# Patient Record
Sex: Female | Born: 1990 | Race: Black or African American | Hispanic: No | Marital: Single | Smoking: Never smoker
Health system: Southern US, Community
[De-identification: ages and names within clinical notes are randomized; demographics above are authoritative.]

## PROBLEM LIST (undated history)

## (undated) ENCOUNTER — Inpatient Hospital Stay (HOSPITAL_COMMUNITY): Payer: Self-pay

## (undated) DIAGNOSIS — N12 Tubulo-interstitial nephritis, not specified as acute or chronic: Secondary | ICD-10-CM

## (undated) DIAGNOSIS — N390 Urinary tract infection, site not specified: Secondary | ICD-10-CM

## (undated) DIAGNOSIS — D649 Anemia, unspecified: Secondary | ICD-10-CM

## (undated) DIAGNOSIS — A749 Chlamydial infection, unspecified: Secondary | ICD-10-CM

## (undated) HISTORY — PX: NO PAST SURGERIES: SHX2092

---
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
## 2010-03-07 ENCOUNTER — Ambulatory Visit: Payer: Self-pay | Admitting: Nurse Practitioner

## 2010-03-07 DIAGNOSIS — L42 Pityriasis rosea: Secondary | ICD-10-CM

## 2010-07-16 NOTE — Assessment & Plan Note (Signed)
Summary: Acute - Rash   Vital Signs:  Patient profile:   20 year old female Menstrual status:  regular LMP:     02/2010 Weight:      135.9 pounds BMI:     23.41 Temp:     97.2 degrees F oral Pulse rate:   72 / minute Pulse rhythm:   regular Resp:     16 per minute BP sitting:   120 / 80  (left arm) Cuff size:   regular  Vitals Entered By: Levon Hedger (March 07, 2010 2:02 PM) CC: rash on arm she thinks it may be ringworm and that it is spreading x 1 week Is Patient Diabetic? No Pain Assessment Patient in pain? no       Does patient need assistance? Functional Status Self care Ambulation Normal LMP (date): 02/2010 LMP - Character: light Menarche (age onset years): 10   Menses interval (days): 28 Menstrual flow (days): 5-6 Enter LMP: 02/2010 Last PAP Result  Specimen Adequacy: Satisfactory for evaluation.   Interpretation/Result:Negative for intraepithelial Lesion or Malignancy.      CC:  rash on arm she thinks it may be ringworm and that it is spreading x 1 week.  History of Present Illness:  Pt into the office for skin issues. Initial rash noted on her arm 2 weeks ago. She applied some clorox to the affected area. She has noted over the past 2 weeks the areas have spread down her left arm and also to the right arm,. chest, back, and left thigh. Areas do not itch. Never had this before. Denies any viral illness Pt does live on campus this year and has suitemates she does use shower shoes. Other suitemates with similar rash  Allergies (verified): No Known Drug Allergies  Review of Systems CV:  Denies chest pain or discomfort. Resp:  Denies cough. GI:  Denies abdominal pain, nausea, and vomiting. Derm:  Complains of rash.  Physical Exam  General:  alert.   Head:  normocephalic.   Lungs:  normal breath sounds.   Heart:  normal rate and regular rhythm.   Skin:  left upper arm with herald patch circular with central clearing left arm, right arm,  abd, and left thigh with smaller ovoid erythematous  with central clearing lestions. Psych:  Oriented X3.     Impression & Recommendations:  Problem # 1:  PITYRIASIS ROSEA (ICD-696.3) advisd pt of the dx  handout given will give antifungal cream but advised pt it will not be effective most importantly she will need to practice good hygiene  Complete Medication List: 1)  Clotrimazole-betamethasone 1-0.05 % Lotn (Clotrimazole-betamethasone) .... Apply topically two times a day to affected area  Patient Instructions: 1)  Read handout 2)  Follow up as needed Prescriptions: CLOTRIMAZOLE-BETAMETHASONE 1-0.05 % LOTN (CLOTRIMAZOLE-BETAMETHASONE) Apply topically two times a day to affected area  #45gm x 0   Entered and Authorized by:   Lehman Prom FNP   Signed by:   Lehman Prom FNP on 03/07/2010   Method used:   Print then Give to Patient   RxID:   [id number]

## 2010-07-16 NOTE — Letter (Signed)
Summary: Work Excuse  [facility]  [address] [place], [place] [address]   Phone: [phone]  Fax: [phone]    Today's Date: August 10, 2009  Name of Patient: Angelica Hester  The above named patient had a medical visit today   Please take this into consideration when reviewing the time away from school.    Special Instructions:  [  ] None  [ X ] To be off the remainder of today, returning to the normal work / school schedule tomorrow.  [  ] To be off until the next scheduled appointment on ______________________.  [  ] Other ________________________________________________________________ ________________________________________________________________________   Sincerely yours,   Lehman Prom FNP [facility]

## 2010-07-16 NOTE — Letter (Signed)
Summary: Handout Printed  Printed Handout:  - Pityriasis Rosea 

## 2010-07-16 NOTE — Letter (Signed)
Summary: [facility]//COMMUNICABLE DISEASE REPORT  [facility]//COMMUNICABLE DISEASE REPORT   Imported By: Arta Bruce 06/19/2009 14:46:19  _____________________________________________________________________  External Attachment:    Type:   Image     Comment:   External Document

## 2010-07-16 NOTE — Assessment & Plan Note (Signed)
Summary: Acute - Pharyngitis   Vital Signs:  Patient profile:   20 year old female Menstrual status:  regular LMP:     07/24/2009 Weight:      130.7 pounds Temp:     99.7 degrees F oral Pulse rate:   94 / minute Pulse rhythm:   regular Resp:     18 per minute BP sitting:   98 / 63  (left arm) Cuff size:   regular  Vitals Entered By: Geanie Cooley  (August 10, 2009 11:29 AM) CC: Pt is here for a sore throat that she has had for the last two days. Pt states she has taken Dayquil for it. Pt states shes been having a lot of chest congestion and coughing as well. Pt states she hasnt had any problems drinking or eating just alot of soreness. Is Patient Diabetic? No Pain Assessment Patient in pain? yes     Location: throat Intensity: 6 Type: aching  Does patient need assistance? Functional Status Self care Ambulation Normal LMP (date): 07/24/2009 LMP - Character: light Menarche (age onset years): 10   Menses interval (days): 28 Menstrual flow (days): 5-6 Menstrual Status regular Enter LMP: 07/24/2009 Last PAP Result  Specimen Adequacy: Satisfactory for evaluation.   Interpretation/Result:Negative for intraepithelial Lesion or Malignancy.      CC:  Pt is here for a sore throat that she has had for the last two days. Pt states she has taken Dayquil for it. Pt states shes been having a lot of chest congestion and coughing as well. Pt states she hasnt had any problems drinking or eating just alot of soreness.Marland Kitchen  History of Present Illness:  Pt into the office with sore throat -ear ache +headache -no nasal congestion  -low grade fever no nausea or vomiting +fatigue and lethergy  Pt is a student [employer]  Allergies (verified): No Known Drug Allergies  Review of Systems General:  Complains of fever. ENT:  Complains of sore throat; denies earache and nasal congestion. CV:  Denies chest pain or discomfort. Resp:  Denies cough.  Physical  Exam  General:  alert.   Head:  normocephalic.   Ears:  bil TM with bony landmarks present no erythema Mouth:  tonsillar enlargement x 2 exudate present bilaterally L>R uvula midline "fruity breath"   Impression & Recommendations:  Problem # 1:  ACUTE PHARYNGITIS (ICD-462) will send for culture gargle with warm salt water warm tea Her updated medication list for this problem includes:    Penicillin V Potassium 250 Mg/60ml Solr (Penicillin v potassium) .Marland Kitchen... 2 teaspoons by mouth three times a day  Orders: Rapid Strep ([address]) T-Culture, Throat ([address])  Complete Medication List: 1)  Nuvaring 0.12-0.015 Mg/24hr Ring (Etonogestrel-ethinyl estradiol) .... Insert into vagina and leave in for 3 weeks.remove for the fourth week to have a period.after the fourth week reinsert a new nuvoring 2)  Penicillin V Potassium 250 Mg/72ml Solr (Penicillin v potassium) .... 2 teaspoons by mouth three times a day  Patient Instructions: 1)  Rapid strep negative. 2)  Will send to lab for culture. 3)  Highly suspect strep throat. 4)  Will treat with penicillin three times daily x 7 days (with food - crackers/bread) 5)  Gargle with warm salt water 6)  drink warm tea 7)  Contagious until antibiotic in system for 1 day 8)  May take ibuprofen as needed for pain (with food)   R re-printed.  She is not able to take pills.  Prescriptions: PENICILLIN V  POTASSIUM 250 MG/5ML SOLR (PENICILLIN V POTASSIUM) 2 teaspoons by mouth three times a day  #272ml x 0   Entered and Authorized by:   Lehman Prom FNP   Signed by:   Lehman Prom FNP on 08/10/2009   Method used:   Printed then faxed to ...       [facility]  [address] [phone][facility] (retail)       [address]       [place], [place]  [id number]       Ph: [phone] or [phone]       Fax: [phone]   RxID:   [id number] PENICILLIN V POTASSIUM 500 MG TABS (PENICILLIN V POTASSIUM) One tablet by mouth three times  a day for infection  #21 x 0   Entered and Authorized by:   Lehman Prom FNP   Signed by:   Lehman Prom FNP on 08/10/2009   Method used:   Printed then faxed to ...       [facility]  [address] [phone]* (retail)       [address]       [place], [place]  [id number]       Ph: [phone] or [phone]       Fax: [phone]   RxID:   [id number]    Vital Signs:  Patient profile:   20 year old female Menstrual status:  regular LMP:     07/24/2009 Weight:      130.7 pounds Temp:     99.7 degrees F oral Pulse rate:   94 / minute Pulse rhythm:   regular Resp:     18 per minute BP sitting:   98 / 63  (left arm) Cuff size:   regular  Vitals Entered By: Geanie Cooley  (August 10, 2009 11:29 AM)   Laboratory Results  Date/Time Received: August 10, 2009 12:08 PM   Other Tests  Rapid Strep: negative

## 2010-07-16 NOTE — Letter (Signed)
Summary: Handout Printed  Printed Handout:  - Pharyngitis (Viral and Bacterial)

## 2010-08-24 ENCOUNTER — Inpatient Hospital Stay (INDEPENDENT_AMBULATORY_CARE_PROVIDER_SITE_OTHER)
Admission: RE | Admit: 2010-08-24 | Discharge: 2010-08-24 | Disposition: A | Discharge status: Home / Self Care | Payer: Self-pay | Source: Ambulatory Visit | Attending: Emergency Medicine | Admitting: Emergency Medicine

## 2010-08-24 DIAGNOSIS — R109 Unspecified abdominal pain: Secondary | ICD-10-CM

## 2010-08-24 DIAGNOSIS — R112 Nausea with vomiting, unspecified: Secondary | ICD-10-CM

## 2010-08-24 DIAGNOSIS — N946 Dysmenorrhea, unspecified: Secondary | ICD-10-CM

## 2010-08-24 LAB — BASIC METABOLIC PANEL
CO2: 23 mEq/L (ref 19–32)
Chloride: 110 mEq/L (ref 96–112)
Creatinine, Ser: 0.83 mg/dL (ref 0.4–1.2)
GFR calc Af Amer: >60 mL/min (ref >60)

## 2010-08-24 LAB — CBC
MCH: 27.8 pg (ref 26.0–34.0)
MCHC: 34.2 g/dL (ref 30.0–36.0)
Platelets: 214 10*3/uL (ref 150–400)

## 2010-08-24 LAB — DIFFERENTIAL
Basophils Relative: 0 % (ref 0–1)
Eosinophils Absolute: 0 10*3/uL (ref 0.0–0.7)
Monocytes Absolute: 0.5 10*3/uL (ref 0.1–1.0)
Monocytes Relative: 3 % (ref 3–12)

## 2010-08-24 LAB — AMYLASE: Amylase: 72 U/L (ref 0–105)

## 2010-08-26 LAB — POCT URINALYSIS DIPSTICK
Bilirubin Urine: NEGATIVE
Glucose, UA: NEGATIVE mg/dL
Specific Gravity, Urine: 1.025 (ref 1.005–1.030)
Urobilinogen, UA: 0.2 mg/dL (ref 0.0–1.0)

## 2010-09-03 LAB — COMPREHENSIVE METABOLIC PANEL
ALT: 16 U/L (ref 0–35)
AST: 20 U/L (ref 0–37)
Alkaline Phosphatase: 71 U/L (ref 39–117)
CO2: 22 mEq/L (ref 19–32)
Chloride: 108 mEq/L (ref 96–112)
GFR calc Af Amer: >60 mL/min (ref >60)
GFR calc non Af Amer: >60 mL/min (ref >60)
Potassium: 3.4 mEq/L — ABNORMAL LOW (ref 3.5–5.1)
Sodium: 138 mEq/L (ref 135–145)
Total Bilirubin: 0.4 mg/dL (ref 0.3–1.2)

## 2010-09-17 LAB — URINE MICROSCOPIC-ADD ON

## 2010-09-17 LAB — URINALYSIS, ROUTINE W REFLEX MICROSCOPIC
Glucose, UA: NEGATIVE mg/dL
Ketones, ur: >80 mg/dL — AB
Protein, ur: 30 mg/dL — AB
pH: 6 (ref 5.0–8.0)

## 2010-09-17 LAB — WET PREP, GENITAL: Trich, Wet Prep: NONE SEEN

## 2010-09-17 LAB — RPR: RPR Ser Ql: NONREACTIVE

## 2010-11-01 NOTE — Group Therapy Note (Signed)
Angelica, [name]:  [id number]   MEDICAL RECORD NO.:  [id number]          PATIENT TYPE:  WOC   LOCATION:  [facility]                   FACILITY:  [facility]   PHYSICIAN:  Kathlyn Sacramento, M.D.   DATE OF BIRTH:  27-Feb-1991   DATE OF SERVICE:  10/23/2005                                    CLINIC NOTE   CHIEF COMPLAINT:  Here for a Pap smear.   HPI:  Patient is a 20 year old African-American female who had an LDSIL Pap  smear on July 15, 2005, who is here for further evaluation and treatment.   ALLERGIES:  NONE.   MEDICINES:  Depo-Provera.   IMMUNIZATIONS:  Rubella, chicken pox and tetanus.   MENSTRUAL HISTORY:  First day of last period September 08, 2005.  Menstrual  cycle began at age 19 __________ menstrual cycles are regular, days between  cycles 26-31.  Periods last 5-7 days with light flow.  No pain with periods.  Bleeding with periods secondary to Depo.   CONTRACEPTIVES:  Depo.   GYN HISTORY:  Last Pap smear January, 2007.   SURGICAL HISTORY:  None.   FAMILY HISTORY:  None.   PAST MEDICAL HISTORY:  None.   SOCIAL HISTORY:  Lives with her mother.  Negative drug and alcohol and  tobacco abuse.   REVIEW OF SYSTEMS:  Negative except for normal menstrual bleeding.   LABS:  Patient had an LGSIL Pap smear on July 15, 2005.   PHYSICAL EXAM:  GENERAL:  A well-developed, well-nourished female in no  acute distress.  Temp 98.6, pulse 80, blood pressure 118/74, weight 118.3.  GU:  Normal external genitalia.  VAGINA:  Pink and rugated.  CERVIX:  Clean.   Pap smear was taken.   IMPRESSION:  Low grade squamous intraepithelial lesion.   PLAN:  A Pap smear with HPV was done.  Plan was discussed with Dr.  __________.  Patient was encouraged to use condoms and recommended Gardasil  vaccine that [facility].           ______________________________  Kathlyn Sacramento, M.D.     AC/MEDQ  D:  10/23/2005  T:  10/24/2005  Job:   [id number]

## 2011-07-24 ENCOUNTER — Emergency Department (INDEPENDENT_AMBULATORY_CARE_PROVIDER_SITE_OTHER)
Admission: EM | Admit: 2011-07-24 | Discharge: 2011-07-24 | Disposition: A | Discharge status: Home / Self Care | Payer: Self-pay | Source: Home / Self Care | Attending: Emergency Medicine | Admitting: Emergency Medicine

## 2011-07-24 ENCOUNTER — Encounter (HOSPITAL_COMMUNITY): Payer: Self-pay | Admitting: Emergency Medicine

## 2011-07-24 DIAGNOSIS — Z2089 Contact with and (suspected) exposure to other communicable diseases: Secondary | ICD-10-CM

## 2011-07-24 DIAGNOSIS — Z0000 Encounter for general adult medical examination without abnormal findings: Secondary | ICD-10-CM

## 2011-07-24 HISTORY — DX: Chlamydial infection, unspecified: A74.9

## 2011-07-24 HISTORY — DX: Anemia, unspecified: D64.9

## 2011-07-24 MED ORDER — PERMETHRIN 5 % EX CREA: TOPICAL_CREAM | CUTANEOUS | Status: AC

## 2011-07-24 NOTE — ED Notes (Signed)
PT HERE WITH BROTHER FOR EXPOSURE OF POSS SCABIES INFECTION.PT STATES I HAVE NO OUTBREAK BUT MY BROTHER DOES.HERE FOR TREATMENT.

## 2011-07-24 NOTE — ED Provider Notes (Signed)
History     CSN: [id number]  Arrival date & time 07/24/11  0912   First MD Initiated Contact with Patient 07/24/11 1034      Chief Complaint  Patient presents with  . New Evaluation    (Consider location/radiation/quality/duration/timing/severity/associated sxs/prior treatment) HPI Comments:  here for patient's states that her brother recently diagnosed with scabies, and is here to make sure that she does not have it. Patient reports no rash, itching. Patient has no symptoms or complaints today.  The history is provided by the patient. No language interpreter was used.    Past Medical History  Diagnosis Date  . Anemia   . Chlamydia     History reviewed. No pertinent past surgical history.  History reviewed. No pertinent family history.  History  Substance Use Topics  . Smoking status: Never Smoker   . Smokeless tobacco: Not on file  . Alcohol Use: No    OB History    Grav Para Term Preterm Abortions TAB SAB Ect Mult Living                  Review of Systems  Constitutional: Negative for fever.  Respiratory: Negative for cough.   Gastrointestinal: Negative for abdominal pain.  Skin: Negative for rash.    Allergies  Review of patient's allergies indicates no known allergies.  Home Medications   Current Outpatient Rx  Name Route Sig Dispense Refill  . PERMETHRIN 5 % EX CREA  Apply from chin down, leave on for 8-14 hours, rinse. Repeat in 1 week 60 g 0    BP 101/62  Pulse 68  Temp(Src) 98.3 F (36.8 C) (Oral)  Resp 16  SpO2 98%  LMP 07/17/2011  Physical Exam  Nursing note and vitals reviewed. Constitutional: She is oriented to person, place, and time. She appears well-developed and well-nourished.  HENT:  Head: Normocephalic and atraumatic.  Eyes: Conjunctivae and EOM are normal.  Neck: Normal range of motion.  Cardiovascular: Normal rate, regular rhythm, normal heart sounds and intact distal pulses.   Pulmonary/Chest: Effort normal and breath  sounds normal. She has no rales.  Abdominal: Soft. She exhibits no distension.  Musculoskeletal: Normal range of motion.  Neurological: She is alert and oriented to person, place, and time.  Skin: Skin is warm and dry. No rash noted.  Psychiatric: She has a normal mood and affect. Her behavior is normal. Judgment and thought content normal.    ED Course  Procedures (including critical care time)  Labs Reviewed - No data to display No results found.   1. Normal physical examination     MDM  No evidence of scabies infection today. Discussed signs and symptoms of scabies infection. Will send home with permethrin in case patient becomes symptomatic.  Luiz Blare, MD 07/24/11 1044

## 2014-06-16 NOTE — L&D Delivery Note (Signed)
Delivery Note Patient pushed well for 15 minutes.  At 3:30 PM a viable female was delivered via Vaginal, Spontaneous Delivery (Presentation: Middle Occiput Anterior).  APGAR: 8, 9; weight 7 lb 5.5 oz (3330 g).   Placenta status: Intact, Spontaneous.  Cord:  with the following complications: None.  Cord pH: n/a  Anesthesia: Epidural  Episiotomy: None Lacerations:  2nd degree Suture Repair: 2.0 vicryl rapide Est. Blood Loss (mL):  150 mL  Mom to postpartum.  Baby to Couplet care / Skin to Skin.  [facility] GEFFEL Thelonious Kauffmann 02/22/2015, 4:43 PM

## 2014-08-02 ENCOUNTER — Encounter (HOSPITAL_COMMUNITY): Payer: Self-pay | Admitting: Emergency Medicine

## 2014-08-02 ENCOUNTER — Emergency Department (INDEPENDENT_AMBULATORY_CARE_PROVIDER_SITE_OTHER)
Admission: EM | Admit: 2014-08-02 | Discharge: 2014-08-02 | Disposition: A | Discharge status: Home / Self Care | Payer: No Typology Code available for payment source | Source: Home / Self Care | Attending: Family Medicine | Admitting: Family Medicine

## 2014-08-02 DIAGNOSIS — J069 Acute upper respiratory infection, unspecified: Secondary | ICD-10-CM

## 2014-08-02 DIAGNOSIS — O210 Mild hyperemesis gravidarum: Secondary | ICD-10-CM

## 2014-08-02 LAB — POCT PREGNANCY, URINE: Preg Test, Ur: POSITIVE — AB

## 2014-08-02 MED ORDER — PROMETHAZINE HCL 25 MG PO TABS: 25.0000 mg | ORAL_TABLET | Freq: Four times a day (QID) | ORAL | Status: DC | PRN

## 2014-08-02 NOTE — ED Notes (Signed)
C/o cold sx onset yest Sx include: chills, BA, vomiting, cough Denies abd pain, diarrhea Alert, no signs of acute distress.

## 2014-08-02 NOTE — Discharge Instructions (Signed)
Thank you for coming in today. Start taking Phenergan as needed for vomiting. Start taking prenatal vitamins. Use Tylenol or ibuprofen as needed for pain fevers or chills. Follow-up with OB[facility]GYN or [facility]. If your belly pain worsens, or you have high fever, bad vomiting, blood in your stool or black tarry stool go to the Emergency Room.   [facility] [address] [place], [place] [phone]  [facility] [address] [place], [place] [phone]  [facility]: Silverio Lay MD [address] [place], [place] [phone]  [facility] [address] [place], [place] [phone]  [facility]: Marcelle Overlie MD [address] [place], [place] [phone]  [facility]: Tracey Harries F MD [address] [place], [place] [phone]  [facility] [address] [place], [place] [phone]   [facility]: [address] [place], [place] [address] [phone]   Upper Respiratory Infection, Adult An upper respiratory infection (URI) is also sometimes known as the common cold. The upper respiratory tract includes the nose, sinuses, throat, trachea, and bronchi. Bronchi are the airways leading to the lungs. Most people improve within 1 week, but symptoms can last up to 2 weeks. A residual cough may last even longer.  CAUSES Many different viruses can infect the tissues lining the upper respiratory tract. The tissues become irritated and inflamed and often become very moist. Mucus production is also common. A cold is contagious. You can easily spread the virus to others by oral contact. This includes kissing, sharing a glass, coughing, or sneezing. Touching your mouth or nose and then touching a surface, which is then touched by another person, can also spread the virus. SYMPTOMS    Symptoms typically develop 1 to 3 days after you come in contact with a cold virus. Symptoms vary from person to person. They may include:  Runny nose.  Sneezing.  Nasal congestion.  Sinus irritation.  Sore throat.  Loss of voice (laryngitis).  Cough.  Fatigue.  Muscle aches.  Loss of appetite.  Headache.  Low-grade fever. DIAGNOSIS  You might diagnose your own cold based on familiar symptoms, since most people get a cold 2 to 3 times a year. Your caregiver can confirm this based on your exam. Most importantly, your caregiver can check that your symptoms are not due to another disease such as strep throat, sinusitis, pneumonia, asthma, or epiglottitis. Blood tests, throat tests, and X-rays are not necessary to diagnose a common cold, but they may sometimes be helpful in excluding other more serious diseases. Your caregiver will decide if any further tests are required. RISKS AND COMPLICATIONS  You may be at risk for a more severe case of the common cold if you smoke cigarettes, have chronic heart disease (such as heart failure) or lung disease (such as asthma), or if you have a weakened immune system. The very young and very old are also at risk for more serious infections. Bacterial sinusitis, middle ear infections, and bacterial pneumonia can complicate the common cold. The common cold can worsen asthma and chronic obstructive pulmonary disease (COPD). Sometimes, these complications can require emergency medical care and may be life-threatening. PREVENTION  The best way to protect against getting a cold is to practice good hygiene. Avoid oral or hand contact with people with cold symptoms. Wash your hands often if contact occurs. There is no clear evidence that vitamin  C, vitamin E, echinacea, or exercise reduces the chance of developing a cold. However, it is always recommended to get plenty of rest and practice good nutrition. TREATMENT  Treatment is directed at relieving  symptoms. There is no cure. Antibiotics are not effective, because the infection is caused by a virus, not by bacteria. Treatment may include:  Increased fluid intake. Sports drinks offer valuable electrolytes, sugars, and fluids.  Breathing heated mist or steam (vaporizer or shower).  Eating chicken soup or other clear broths, and maintaining good nutrition.  Getting plenty of rest.  Using gargles or lozenges for comfort.  Controlling fevers with ibuprofen or acetaminophen as directed by your caregiver.  Increasing usage of your inhaler if you have asthma. Zinc gel and zinc lozenges, taken in the first 24 hours of the common cold, can shorten the duration and lessen the severity of symptoms. Pain medicines may help with fever, muscle aches, and throat pain. A variety of non-prescription medicines are available to treat congestion and runny nose. Your caregiver can make recommendations and may suggest nasal or lung inhalers for other symptoms.  HOME CARE INSTRUCTIONS   Only take over-the-counter or prescription medicines for pain, discomfort, or fever as directed by your caregiver.  Use a warm mist humidifier or inhale steam from a shower to increase air moisture. This may keep secretions moist and make it easier to breathe.  Drink enough water and fluids to keep your urine clear or pale yellow.  Rest as needed.  Return to work when your temperature has returned to normal or as your caregiver advises. You may need to stay home longer to avoid infecting others. You can also use a face mask and careful hand washing to prevent spread of the virus. SEEK MEDICAL CARE IF:   After the first few days, you feel you are getting worse rather than better.  You need your caregiver's advice about medicines to control symptoms.  You develop chills, worsening shortness of breath, or brown or red sputum. These may be signs of pneumonia.  You develop yellow or brown nasal discharge or pain in the  face, especially when you bend forward. These may be signs of sinusitis.  You develop a fever, swollen neck glands, pain with swallowing, or white areas in the back of your throat. These may be signs of strep throat. SEEK IMMEDIATE MEDICAL CARE IF:   You have a fever.  You develop severe or persistent headache, ear pain, sinus pain, or chest pain.  You develop wheezing, a prolonged cough, cough up blood, or have a change in your usual mucus (if you have chronic lung disease).  You develop sore muscles or a stiff neck. Document Released: 11/26/2000 Document Revised: 08/25/2011 Document Reviewed: 09/07/2013 [facility] Patient Information 2015 [place], [place]. This information is not intended to replace advice given to you by your health care provider. Make sure you discuss any questions you have with your health care provider.   Morning Sickness Morning sickness is when you feel sick to your stomach (nauseous) during pregnancy. This nauseous feeling may or may not come with vomiting. It often occurs in the morning but can be a problem any time of day. Morning sickness is most common during the first trimester, but it may continue throughout pregnancy. While morning sickness is unpleasant, it is usually harmless unless you develop severe and continual vomiting (hyperemesis gravidarum). This condition requires more intense treatment.  CAUSES  The cause of morning sickness is not completely known but seems to  be related to normal hormonal changes that occur in pregnancy. RISK FACTORS You are at greater risk if you:  Experienced nausea or vomiting before your pregnancy.  Had morning sickness during a previous pregnancy.  Are pregnant with more than one baby, such as twins. TREATMENT  Do not use any medicines (prescription, over-the-counter, or herbal) for morning sickness without first talking to your health care provider. Your health care provider may prescribe or recommend:  Vitamin B6  supplements.  Anti-nausea medicines.  The herbal medicine ginger. HOME CARE INSTRUCTIONS   Only take over-the-counter or prescription medicines as directed by your health care provider.  Taking multivitamins before getting pregnant can prevent or decrease the severity of morning sickness in most women.  Eat a piece of dry toast or unsalted crackers before getting out of bed in the morning.  Eat five or six small meals a day.  Eat dry and bland foods (rice, baked potato). Foods high in carbohydrates are often helpful.  Do not drink liquids with your meals. Drink liquids between meals.  Avoid greasy, fatty, and spicy foods.  Get someone to cook for you if the smell of any food causes nausea and vomiting.  If you feel nauseous after taking prenatal vitamins, take the vitamins at night or with a snack.  Snack on protein foods (nuts, yogurt, cheese) between meals if you are hungry.  Eat unsweetened gelatins for desserts.  Wearing an acupressure wristband (worn for sea sickness) may be helpful.  Acupuncture may be helpful.  Do not smoke.  Get a humidifier to keep the air in your house free of odors.  Get plenty of fresh air. SEEK MEDICAL CARE IF:   Your home remedies are not working, and you need medicine.  You feel dizzy or lightheaded.  You are losing weight. SEEK IMMEDIATE MEDICAL CARE IF:   You have persistent and uncontrolled nausea and vomiting.  You pass out (faint). MAKE SURE YOU:  Understand these instructions.  Will watch your condition.  Will get help right away if you are not doing well or get worse. Document Released: 07/24/2006 Document Revised: 06/07/2013 Document Reviewed: 11/17/2012 [facility] Patient Information 2015 [place], [place]. This information is not intended to replace advice given to you by your health care provider. Make sure you discuss any questions you have with your health care provider.

## 2014-08-02 NOTE — ED Provider Notes (Signed)
Angelica Hester is a 24 y.o. female who presents to Urgent Care today for chills body aches and vomiting. No fever or abdominal pain. LMP was around 6 weeks ago. No birth control used. Trouble breathing or diarrhea. No urinary frequency urgency or dysuria.   Past Medical History  Diagnosis Date  . Anemia   . Chlamydia    History reviewed. No pertinent past surgical history. History  Substance Use Topics  . Smoking status: Never Smoker   . Smokeless tobacco: Not on file  . Alcohol Use: No   ROS as above Medications: No current facility-administered medications for this encounter.   Current Outpatient Prescriptions  Medication Sig Dispense Refill  . promethazine (PHENERGAN) 25 MG tablet Take 1 tablet (25 mg total) by mouth every 6 (six) hours as needed for nausea or vomiting. 20 tablet 0   No Known Allergies   Exam:  BP 99/59 mmHg  Pulse 96  Temp(Src) 99.4 F (37.4 C) (Oral)  Resp 20  SpO2 95%  LMP 06/29/2014 Gen: Well NAD HEENT: EOMI,  MMM normal posterior pharynx and tympanic membranes. Clear nasal discharge. Lungs: Normal work of breathing. CTABL Heart: RRR no MRG Abd: NABS, Soft. Nondistended, Nontender Exts: Brisk capillary refill, warm and well perfused.   Results for orders placed or performed during the hospital encounter of 08/02/14 (from the past 24 hour(s))  Pregnancy, urine POC     Status: Abnormal   Collection Time: 08/02/14  6:05 PM  Result Value Ref Range   Preg Test, Ur POSITIVE (A) NEGATIVE   No results found.  Assessment and Plan: 24 y.o. female with morning sickness in the setting of a new viral URI. Use Tylenol or ibuprofen for symptoms as well as Phenergan. Initiate prenatal vitamins. Follow-up with OB.  Discussed warning signs or symptoms. Please see discharge instructions. Patient expresses understanding.     Rodolph BongEvan S Dang Mathison, MD 08/02/14 [phone]

## 2014-09-07 ENCOUNTER — Inpatient Hospital Stay (HOSPITAL_COMMUNITY)
Admission: AD | Admit: 2014-09-07 | Discharge: 2014-09-08 | Disposition: A | Discharge status: Home / Self Care | Payer: Medicaid Other | Source: Ambulatory Visit | Attending: Obstetrics and Gynecology | Admitting: Obstetrics and Gynecology

## 2014-09-07 ENCOUNTER — Encounter (HOSPITAL_COMMUNITY): Payer: Self-pay | Admitting: *Deleted

## 2014-09-07 DIAGNOSIS — Z3A13 13 weeks gestation of pregnancy: Secondary | ICD-10-CM | POA: Diagnosis not present

## 2014-09-07 DIAGNOSIS — N760 Acute vaginitis: Secondary | ICD-10-CM | POA: Diagnosis present

## 2014-09-07 DIAGNOSIS — O2341 Unspecified infection of urinary tract in pregnancy, first trimester: Secondary | ICD-10-CM | POA: Diagnosis not present

## 2014-09-07 LAB — URINALYSIS, ROUTINE W REFLEX MICROSCOPIC
Bilirubin Urine: NEGATIVE
Glucose, UA: NEGATIVE mg/dL
Ketones, ur: NEGATIVE mg/dL
LEUKOCYTES UA: NEGATIVE
NITRITE: POSITIVE — AB
PH: 6 (ref 5.0–8.0)
Protein, ur: NEGATIVE mg/dL
UROBILINOGEN UA: 0.2 mg/dL (ref 0.0–1.0)

## 2014-09-07 LAB — URINE MICROSCOPIC-ADD ON

## 2014-09-07 NOTE — MAU Note (Signed)
Pt states SO was exposed to NGU and infection. Pt states she does not have any symptoms

## 2014-09-07 NOTE — MAU Note (Signed)
Pt reports her boyfriend went to the doctor and they told him he had "NGU" infection and pt states she has been exposed. Pt denies any symptoms.

## 2014-09-08 DIAGNOSIS — O2341 Unspecified infection of urinary tract in pregnancy, first trimester: Secondary | ICD-10-CM

## 2014-09-08 LAB — WET PREP, GENITAL
Trich, Wet Prep: NONE SEEN
Yeast Wet Prep HPF POC: NONE SEEN

## 2014-09-08 LAB — GC/CHLAMYDIA PROBE AMP (~~LOC~~) NOT AT ARMC
Chlamydia: NEGATIVE
NEISSERIA GONORRHEA: NEGATIVE

## 2014-09-08 MED ORDER — NITROFURANTOIN MONOHYD MACRO 100 MG PO CAPS: 100.0000 mg | ORAL_CAPSULE | Freq: Two times a day (BID) | ORAL | Status: DC

## 2014-09-08 NOTE — Discharge Instructions (Signed)
Pregnancy and Urinary Tract Infection °A urinary tract infection (UTI) is a bacterial infection of the urinary tract. Infection of the urinary tract can include the ureters, kidneys (pyelonephritis), bladder (cystitis), and urethra (urethritis). All pregnant women should be screened for bacteria in the urinary tract. Identifying and treating a UTI will decrease the risk of preterm labor and developing more serious infections in both the mother and baby. °CAUSES °Bacteria germs cause almost all UTIs.  °RISK FACTORS °Many factors can increase your chances of getting a UTI during pregnancy. These include: °· Having a short urethra. °· Poor toilet and hygiene habits. °· Sexual intercourse. °· Blockage of urine along the urinary tract. °· Problems with the pelvic muscles or nerves. °· Diabetes. °· Obesity. °· Bladder problems after having several children. °· Previous history of UTI. °SIGNS AND SYMPTOMS  °· Pain, burning, or a stinging feeling when urinating. °· Suddenly feeling the need to urinate right away (urgency). °· Loss of bladder control (urinary incontinence). °· Frequent urination, more than is common with pregnancy. °· Lower abdominal or back discomfort. °· Cloudy urine. °· Blood in the urine (hematuria). °· Fever.  °When the kidneys are infected, the symptoms may be: °· Back pain. °· Flank pain on the right side more so than the left. °· Fever. °· Chills. °· Nausea. °· Vomiting. °DIAGNOSIS  °A urinary tract infection is usually diagnosed through urine tests. Additional tests and procedures are sometimes done. These may include: °· Ultrasound exam of the kidneys, ureters, bladder, and urethra. °· Looking in the bladder with a lighted tube (cystoscopy). °TREATMENT °Typically, UTIs can be treated with antibiotic medicines.  °HOME CARE INSTRUCTIONS  °· Only take over-the-counter or prescription medicines as directed by your health care provider. If you were prescribed antibiotics, take them as directed. Finish  them even if you start to feel better. °· Drink enough fluids to keep your urine clear or pale yellow. °· Do not have sexual intercourse until the infection is gone and your health care provider says it is okay. °· Make sure you are tested for UTIs throughout your pregnancy. These infections often come back.  °Preventing a UTI in the Future °· Practice good toilet habits. Always wipe from front to back. Use the tissue only once. °· Do not hold your urine. Empty your bladder as soon as possible when the urge comes. °· Do not douche or use deodorant sprays. °· Wash with soap and warm water around the genital area and the anus. °· Empty your bladder before and after sexual intercourse. °· Wear underwear with a cotton crotch. °· Avoid caffeine and carbonated drinks. They can irritate the bladder. °· Drink cranberry juice or take cranberry pills. This may decrease the risk of getting a UTI. °· Do not drink alcohol. °· Keep all your appointments and tests as scheduled.  °SEEK MEDICAL CARE IF:  °· Your symptoms get worse. °· You are still having fevers 2 or more days after treatment begins. °· You have a rash. °· You feel that you are having problems with medicines prescribed. °· You have abnormal vaginal discharge. °SEEK IMMEDIATE MEDICAL CARE IF:  °· You have back or flank pain. °· You have chills. °· You have blood in your urine. °· You have nausea and vomiting. °· You have contractions of your uterus. °· You have a gush of fluid from the vagina. °MAKE SURE YOU: °· Understand these instructions.   °· Will watch your condition.   °· Will get help right away if you are not doing   well or get worse.   °Document Released: 09/27/2010 Document Revised: 03/23/2013 Document Reviewed: 12/30/2012 °ExitCare® Patient Information ©2015 [employer]. This information is not intended to replace advice given to you by your health care provider. Make sure you discuss any questions you have with your health care provider. ° °

## 2014-09-08 NOTE — MAU Provider Note (Signed)
History     CSN: [id number]  Arrival date and time: 09/07/14 2218   First Provider Initiated Contact with Patient 09/08/14 [phone]      Chief Complaint  Patient presents with  . Vaginitis   HPI Comments: Angelica Hester is a 24 y.o. G1P0 at [redacted]w[redacted]d who presents today because she was states that her partner just told her that he had "NGU". She states that he told her that the doctor told her to seek treatment. She states that he was having urinary tract infection symptoms.   Urinary Tract Infection  This is a new problem. The current episode started yesterday. The patient is experiencing no pain. There has been no fever. She is sexually active. Pertinent negatives include no frequency, nausea, urgency or vomiting.     Past Medical History  Diagnosis Date  . Anemia   . Chlamydia     History reviewed. No pertinent past surgical history.  Family History  Problem Relation Age of Onset  . Asthma Mother   . Asthma Brother     History  Substance Use Topics  . Smoking status: Never Smoker   . Smokeless tobacco: Not on file  . Alcohol Use: No    Allergies: No Known Allergies  Prescriptions prior to admission  Medication Sig Dispense Refill Last Dose  . promethazine (PHENERGAN) 25 MG tablet Take 1 tablet (25 mg total) by mouth every 6 (six) hours as needed for nausea or vomiting. 20 tablet 0     Review of Systems  Constitutional: Negative for fever.  Gastrointestinal: Negative for nausea, vomiting, abdominal pain, diarrhea and constipation.  Genitourinary: Negative for dysuria, urgency and frequency.       She denies any vaginal discharge.    Physical Exam   Blood pressure 124/72, pulse 74, temperature 98.2 F (36.8 C), temperature source Oral, resp. rate 18, height  (1.651 m), weight 63.56 kg (140 lb 2 oz), last menstrual period 06/07/2014, SpO2 100 %.  Physical Exam  Nursing note and vitals reviewed. Constitutional: She is oriented to person, place, and time. She  appears well-developed and well-nourished. No distress.  Cardiovascular: Normal rate.   Respiratory: Effort normal.  GI: Soft. There is no tenderness. There is no rebound.  Neurological: She is alert and oriented to person, place, and time.  Skin: Skin is warm and dry.  Psychiatric: She has a normal mood and affect.   Results for orders placed or performed during the hospital encounter of 09/07/14 (from the past 24 hour(s))  Urinalysis, Routine w reflex microscopic     Status: Abnormal   Collection Time: 09/07/14 10:49 PM  Result Value Ref Range   Color, Urine YELLOW YELLOW   APPearance CLEAR CLEAR   Specific Gravity, Urine >1.030 (H) 1.005 - 1.030   pH 6.0 5.0 - 8.0   Glucose, UA NEGATIVE NEGATIVE mg/dL   Hgb urine dipstick SMALL (A) NEGATIVE   Bilirubin Urine NEGATIVE NEGATIVE   Ketones, ur NEGATIVE NEGATIVE mg/dL   Protein, ur NEGATIVE NEGATIVE mg/dL   Urobilinogen, UA 0.2 0.0 - 1.0 mg/dL   Nitrite POSITIVE (A) NEGATIVE   Leukocytes, UA NEGATIVE NEGATIVE  Urine microscopic-add on     Status: Abnormal   Collection Time: 09/07/14 10:49 PM  Result Value Ref Range   Squamous Epithelial / LPF RARE RARE   WBC, UA 0-2 <3 WBC/hpf   RBC / HPF 0-2 <3 RBC/hpf   Bacteria, UA FEW (A) RARE  Wet prep, genital     Status: Abnormal  Collection Time: 09/07/14 11:40 PM  Result Value Ref Range   Yeast Wet Prep HPF POC NONE SEEN NONE SEEN   Trich, Wet Prep NONE SEEN NONE SEEN   Clue Cells Wet Prep HPF POC FEW (A) NONE SEEN   WBC, Wet Prep HPF POC RARE (A) NONE SEEN     MAU Course  Procedures  MDM D/W the patient at length about treatment for GC/CT today or waiting for results. Patient would like to wait for results before treatment.   Assessment and Plan   1. UTI in pregnancy, antepartum, first trimester    DC home Rx: Macrobid GC/CT pending [facility]   Follow-up Information    [facility] Sanjuana MaeWALDA STACIA, MD.   Specialty:  Obstetrics  and Gynecology   Why:  As scheduled   Contact information:   [address] [place] [address] [phone]       Tawnya CrookHogan, Isa Kohlenberg Donovan 09/08/2014, 12:43 AM

## 2014-09-10 LAB — URINE CULTURE: Colony Count: >=100000

## 2014-09-18 ENCOUNTER — Other Ambulatory Visit: Payer: Self-pay | Admitting: Obstetrics & Gynecology

## 2014-09-18 LAB — OB RESULTS CONSOLE ABO/RH

## 2014-09-18 LAB — OB RESULTS CONSOLE GC/CHLAMYDIA: GC PROBE AMP, GENITAL: NEGATIVE

## 2014-09-18 LAB — OB RESULTS CONSOLE RUBELLA ANTIBODY, IGM: Rubella: IMMUNE

## 2014-09-19 LAB — OB RESULTS CONSOLE HEPATITIS B SURFACE ANTIGEN: Hepatitis B Surface Ag: NEGATIVE

## 2014-09-19 LAB — OB RESULTS CONSOLE GBS: GBS: NEGATIVE

## 2014-09-19 LAB — OB RESULTS CONSOLE RPR: RPR: NONREACTIVE

## 2014-09-19 LAB — OB RESULTS CONSOLE HIV ANTIBODY (ROUTINE TESTING): HIV: NONREACTIVE

## 2014-09-19 LAB — CYTOLOGY - PAP

## 2014-09-21 LAB — OB RESULTS CONSOLE ABO/RH: RH Type: POSITIVE

## 2014-12-11 ENCOUNTER — Inpatient Hospital Stay (HOSPITAL_COMMUNITY)
Admission: AD | Admit: 2014-12-11 | Discharge: 2014-12-11 | Disposition: A | Discharge status: Home / Self Care | Payer: No Typology Code available for payment source | Source: Ambulatory Visit | Attending: Obstetrics and Gynecology | Admitting: Obstetrics and Gynecology

## 2014-12-11 ENCOUNTER — Encounter (HOSPITAL_COMMUNITY): Payer: Self-pay

## 2014-12-11 DIAGNOSIS — Z3A31 31 weeks gestation of pregnancy: Secondary | ICD-10-CM | POA: Insufficient documentation

## 2014-12-11 DIAGNOSIS — N3001 Acute cystitis with hematuria: Secondary | ICD-10-CM | POA: Diagnosis not present

## 2014-12-11 DIAGNOSIS — O2343 Unspecified infection of urinary tract in pregnancy, third trimester: Secondary | ICD-10-CM | POA: Insufficient documentation

## 2014-12-11 DIAGNOSIS — R1111 Vomiting without nausea: Secondary | ICD-10-CM

## 2014-12-11 DIAGNOSIS — R531 Weakness: Secondary | ICD-10-CM | POA: Diagnosis present

## 2014-12-11 LAB — URINALYSIS, ROUTINE W REFLEX MICROSCOPIC
BILIRUBIN URINE: NEGATIVE
GLUCOSE, UA: 250 mg/dL — AB
KETONES UR: NEGATIVE mg/dL
NITRITE: POSITIVE — AB
PROTEIN: NEGATIVE mg/dL
Specific Gravity, Urine: 1.015 (ref 1.005–1.030)
Urobilinogen, UA: 0.2 mg/dL (ref 0.0–1.0)
pH: 6 (ref 5.0–8.0)

## 2014-12-11 LAB — URINE MICROSCOPIC-ADD ON

## 2014-12-11 MED ORDER — PROMETHAZINE HCL 25 MG PO TABS: 25.0000 mg | ORAL_TABLET | Freq: Four times a day (QID) | ORAL | Status: DC | PRN

## 2014-12-11 MED ORDER — NITROFURANTOIN MONOHYD MACRO 100 MG PO CAPS: 100.0000 mg | ORAL_CAPSULE | Freq: Two times a day (BID) | ORAL | Status: DC

## 2014-12-11 NOTE — MAU Note (Signed)
Pt reports she has been feeling weak all day vomited x one this am, states she just doesn't feel like eating. Lower back pain today.

## 2014-12-11 NOTE — Discharge Instructions (Signed)
Nausea and Vomiting °Nausea is a sick feeling that often comes before throwing up (vomiting). Vomiting is a reflex where stomach contents come out of your mouth. Vomiting can cause severe loss of body fluids (dehydration). Children and elderly adults can become dehydrated quickly, especially if they also have diarrhea. Nausea and vomiting are symptoms of a condition or disease. It is important to find the cause of your symptoms. °CAUSES  °· Direct irritation of the stomach lining. This irritation can result from increased acid production (gastroesophageal reflux disease), infection, food poisoning, taking certain medicines (such as nonsteroidal anti-inflammatory drugs), alcohol use, or tobacco use. °· Signals from the brain. These signals could be caused by a headache, heat exposure, an inner ear disturbance, increased pressure in the brain from injury, infection, a tumor, or a concussion, pain, emotional stimulus, or metabolic problems. °· An obstruction in the gastrointestinal tract (bowel obstruction). °· Illnesses such as diabetes, hepatitis, gallbladder problems, appendicitis, kidney problems, cancer, sepsis, atypical symptoms of a heart attack, or eating disorders. °· Medical treatments such as chemotherapy and radiation. °· Receiving medicine that makes you sleep (general anesthetic) during surgery. °DIAGNOSIS °Your caregiver may ask for tests to be done if the problems do not improve after a few days. Tests may also be done if symptoms are severe or if the reason for the nausea and vomiting is not clear. Tests may include: °· Urine tests. °· Blood tests. °· Stool tests. °· Cultures (to look for evidence of infection). °· X-rays or other imaging studies. °Test results can help your caregiver make decisions about treatment or the need for additional tests. °TREATMENT °You need to stay well hydrated. Drink frequently but in small amounts. You may wish to drink water, sports drinks, clear broth, or eat frozen  ice pops or gelatin dessert to help stay hydrated. When you eat, eating slowly may help prevent nausea. There are also some antinausea medicines that may help prevent nausea. °HOME CARE INSTRUCTIONS  °· Take all medicine as directed by your caregiver. °· If you do not have an appetite, do not force yourself to eat. However, you must continue to drink fluids. °· If you have an appetite, eat a normal diet unless your caregiver tells you differently. °¨ Eat a variety of complex carbohydrates (rice, wheat, potatoes, bread), lean meats, yogurt, fruits, and vegetables. °¨ Avoid high-fat foods because they are more difficult to digest. °· Drink enough water and fluids to keep your urine clear or pale yellow. °· If you are dehydrated, ask your caregiver for specific rehydration instructions. Signs of dehydration may include: °¨ Severe thirst. °¨ Dry lips and mouth. °¨ Dizziness. °¨ Dark urine. °¨ Decreasing urine frequency and amount. °¨ Confusion. °¨ Rapid breathing or pulse. °SEEK IMMEDIATE MEDICAL CARE IF:  °· You have blood or brown flecks (like coffee grounds) in your vomit. °· You have black or bloody stools. °· You have a severe headache or stiff neck. °· You are confused. °· You have severe abdominal pain. °· You have chest pain or trouble breathing. °· You do not urinate at least once every 8 hours. °· You develop cold or clammy skin. °· You continue to vomit for longer than 24 to 48 hours. °· You have a fever. °MAKE SURE YOU:  °· Understand these instructions. °· Will watch your condition. °· Will get help right away if you are not doing well or get worse. °Document Released: 06/02/2005 Document Revised: 08/25/2011 Document Reviewed: 10/30/2010 °ExitCare® Patient Information ©2015 [employer]. This information is not intended   to replace advice given to you by your health care provider. Make sure you discuss any questions you have with your health care provider. ° °Urinary Tract Infection °Urinary tract  infections (UTIs) can develop anywhere along your urinary tract. Your urinary tract is your body's drainage system for removing wastes and extra water. Your urinary tract includes two kidneys, two ureters, a bladder, and a urethra. Your kidneys are a pair of bean-shaped organs. Each kidney is about the size of your fist. They are located below your ribs, one on each side of your spine. °CAUSES °Infections are caused by microbes, which are microscopic organisms, including fungi, viruses, and bacteria. These organisms are so small that they can only be seen through a microscope. Bacteria are the microbes that most commonly cause UTIs. °SYMPTOMS  °Symptoms of UTIs may vary by age and gender of the patient and by the location of the infection. Symptoms in young women typically include a frequent and intense urge to urinate and a painful, burning feeling in the bladder or urethra during urination. Older women and men are more likely to be tired, shaky, and weak and have muscle aches and abdominal pain. A fever may mean the infection is in your kidneys. Other symptoms of a kidney infection include pain in your back or sides below the ribs, nausea, and vomiting. °DIAGNOSIS °To diagnose a UTI, your caregiver will ask you about your symptoms. Your caregiver also will ask to provide a urine sample. The urine sample will be tested for bacteria and white blood cells. White blood cells are made by your body to help fight infection. °TREATMENT  °Typically, UTIs can be treated with medication. Because most UTIs are caused by a bacterial infection, they usually can be treated with the use of antibiotics. The choice of antibiotic and length of treatment depend on your symptoms and the type of bacteria causing your infection. °HOME CARE INSTRUCTIONS °· If you were prescribed antibiotics, take them exactly as your caregiver instructs you. Finish the medication even if you feel better after you have only taken some of the  medication. °· Drink enough water and fluids to keep your urine clear or pale yellow. °· Avoid caffeine, tea, and carbonated beverages. They tend to irritate your bladder. °· Empty your bladder often. Avoid holding urine for long periods of time. °· Empty your bladder before and after sexual intercourse. °· After a bowel movement, women should cleanse from front to back. Use each tissue only once. °SEEK MEDICAL CARE IF:  °· You have back pain. °· You develop a fever. °· Your symptoms do not begin to resolve within 3 days. °SEEK IMMEDIATE MEDICAL CARE IF:  °· You have severe back pain or lower abdominal pain. °· You develop chills. °· You have nausea or vomiting. °· You have continued burning or discomfort with urination. °MAKE SURE YOU:  °· Understand these instructions. °· Will watch your condition. °· Will get help right away if you are not doing well or get worse. °Document Released: 03/12/2005 Document Revised: 12/02/2011 Document Reviewed: 07/11/2011 °ExitCare® Patient Information ©2015 [employer]. This information is not intended to replace advice given to you by your health care provider. Make sure you discuss any questions you have with your health care provider. ° °

## 2014-12-11 NOTE — MAU Provider Note (Signed)
History     CSN: [id number]  Arrival date and time: 12/11/14 2001   None     No chief complaint on file.  HPI This is a 24 y.o. female at [redacted]w[redacted]d who presents with c/o weakness all day today, and low back pain which she has had "for a while". . States she vomited once this morning. No nausea since. Ate some oatmeal and kept it down. Then went to sleep. States woke up at 7pm and "still feel weak" so decided to come in.  Denies pain, fever, constipation or diarrhea. Reports good fetal movement. Denies bleeding.   RN Note:  Expand All Collapse All   Pt reports she has been feeling weak all day vomited x one this am, states she just doesn't feel like eating. Lower back pain today.         OB History    Gravida Para Term Preterm AB TAB SAB Ectopic Multiple Living   1               Past Medical History  Diagnosis Date  . Anemia   . Chlamydia     No past surgical history on file.  Family History  Problem Relation Age of Onset  . Asthma Mother   . Asthma Brother     History  Substance Use Topics  . Smoking status: Never Smoker   . Smokeless tobacco: Not on file  . Alcohol Use: No    Allergies: No Known Allergies  Prescriptions prior to admission  Medication Sig Dispense Refill Last Dose  . nitrofurantoin, macrocrystal-monohydrate, (MACROBID) 100 MG capsule Take 1 capsule (100 mg total) by mouth 2 (two) times daily. 14 capsule 0   . promethazine (PHENERGAN) 25 MG tablet Take 1 tablet (25 mg total) by mouth every 6 (six) hours as needed for nausea or vomiting. 20 tablet 0     Review of Systems  Constitutional: Positive for malaise/fatigue. Negative for fever and chills.  Gastrointestinal: Positive for vomiting. Negative for nausea, abdominal pain, diarrhea and constipation.  Genitourinary: Negative for dysuria, urgency, frequency and hematuria.  Musculoskeletal: Positive for back pain.  Neurological: Positive for weakness. Negative for dizziness, focal weakness,  loss of consciousness and headaches.  Psychiatric/Behavioral: The patient is not nervous/anxious.    Physical Exam   Blood pressure 127/68, pulse 98, temperature 98.7 F (37.1 C), temperature source Oral, resp. rate 15, height  (1.651 m), weight 176 lb (79.833 kg), last menstrual period 06/07/2014, SpO2 98 %.  Physical Exam  Constitutional: She is oriented to person, place, and time. She appears well-developed and well-nourished. No distress.  HENT:  Head: Normocephalic.  Cardiovascular: Normal rate, regular rhythm and normal heart sounds.  Exam reveals no gallop and no friction rub.   No murmur heard. Respiratory: Effort normal and breath sounds normal. No respiratory distress. She has no wheezes. She has no rales.  GI: Soft. She exhibits no distension and no mass. There is no tenderness. There is no rebound and no guarding.  Genitourinary: No vaginal discharge found.  Fetal heart rate reactive, Category I No contractions   Musculoskeletal: Normal range of motion. She exhibits no tenderness (No CVA tenderness).  Neurological: She is alert and oriented to person, place, and time.  Skin: Skin is warm and dry.  Psychiatric: She has a normal mood and affect.    MAU Course  Procedures  MDM UA results reviewed. It appears she had a UTI, based on Nitrites, leukocytes and hemoglobin Consulted Dr Tenny Craw.  She recommends treatment with Macrobid and pushing fluids   Assessment and Plan  A;  SIUP at [id number]       Urinary Tract Infection      No evidence of pyelonephritis       History of vomiting once at home 12 hours ago, no further nausea or vomiting       Dilute urine, demonstrating no overt dehydration       Reactive fetal heart rate tracing, category I  P:   Discharge home       Rx Macrobid x 7 days       Recommend pushing fluids PO       Rx Phenergan for PRN use for nausea       [facility] Skeen 12/11/2014, 8:16 PM

## 2014-12-12 ENCOUNTER — Encounter (HOSPITAL_COMMUNITY): Payer: Self-pay | Admitting: *Deleted

## 2014-12-12 ENCOUNTER — Inpatient Hospital Stay (HOSPITAL_COMMUNITY)
Admission: AD | Admit: 2014-12-12 | Discharge: 2014-12-19 | DRG: 781 | Disposition: A | Discharge status: Home / Self Care | Payer: No Typology Code available for payment source | Source: Ambulatory Visit | Attending: Obstetrics and Gynecology | Admitting: Obstetrics and Gynecology

## 2014-12-12 DIAGNOSIS — B9620 Unspecified Escherichia coli [E. coli] as the cause of diseases classified elsewhere: Secondary | ICD-10-CM | POA: Diagnosis not present

## 2014-12-12 DIAGNOSIS — N12 Tubulo-interstitial nephritis, not specified as acute or chronic: Secondary | ICD-10-CM | POA: Diagnosis present

## 2014-12-12 DIAGNOSIS — O26893 Other specified pregnancy related conditions, third trimester: Secondary | ICD-10-CM | POA: Diagnosis not present

## 2014-12-12 DIAGNOSIS — O2303 Infections of kidney in pregnancy, third trimester: Principal | ICD-10-CM

## 2014-12-12 DIAGNOSIS — O2343 Unspecified infection of urinary tract in pregnancy, third trimester: Secondary | ICD-10-CM | POA: Diagnosis not present

## 2014-12-12 DIAGNOSIS — Z3A31 31 weeks gestation of pregnancy: Secondary | ICD-10-CM

## 2014-12-12 DIAGNOSIS — O99013 Anemia complicating pregnancy, third trimester: Secondary | ICD-10-CM | POA: Diagnosis present

## 2014-12-12 DIAGNOSIS — K5900 Constipation, unspecified: Secondary | ICD-10-CM | POA: Diagnosis not present

## 2014-12-12 DIAGNOSIS — D649 Anemia, unspecified: Secondary | ICD-10-CM | POA: Diagnosis present

## 2014-12-12 DIAGNOSIS — R509 Fever, unspecified: Secondary | ICD-10-CM | POA: Diagnosis not present

## 2014-12-12 LAB — URINALYSIS, ROUTINE W REFLEX MICROSCOPIC
BILIRUBIN URINE: NEGATIVE
Glucose, UA: 500 mg/dL — AB
KETONES UR: NEGATIVE mg/dL
Nitrite: NEGATIVE
Protein, ur: 100 mg/dL — AB
Specific Gravity, Urine: 1.02 (ref 1.005–1.030)
UROBILINOGEN UA: 0.2 mg/dL (ref 0.0–1.0)
pH: 6 (ref 5.0–8.0)

## 2014-12-12 LAB — COMPREHENSIVE METABOLIC PANEL
ALT: 16 U/L (ref 14–54)
ANION GAP: 7 (ref 5–15)
AST: 22 U/L (ref 15–41)
Albumin: 2.9 g/dL — ABNORMAL LOW (ref 3.5–5.0)
Alkaline Phosphatase: 105 U/L (ref 38–126)
BUN: 7 mg/dL (ref 6–20)
CHLORIDE: 105 mmol/L (ref 101–111)
CO2: 20 mmol/L — AB (ref 22–32)
Calcium: 8.8 mg/dL — ABNORMAL LOW (ref 8.9–10.3)
Creatinine, Ser: 0.79 mg/dL (ref 0.44–1.00)
GFR calc Af Amer: >60 mL/min (ref >60)
Glucose, Bld: 146 mg/dL — ABNORMAL HIGH (ref 65–99)
Potassium: 3.7 mmol/L (ref 3.5–5.1)
Sodium: 132 mmol/L — ABNORMAL LOW (ref 135–145)
Total Bilirubin: 0.3 mg/dL (ref 0.3–1.2)
Total Protein: 6.4 g/dL — ABNORMAL LOW (ref 6.5–8.1)

## 2014-12-12 LAB — CBC WITH DIFFERENTIAL/PLATELET
BASOS PCT: 0 % (ref 0–1)
Basophils Absolute: 0 10*3/uL (ref 0.0–0.1)
EOS ABS: 0 10*3/uL (ref 0.0–0.7)
EOS PCT: 0 % (ref 0–5)
HCT: 29.8 % — ABNORMAL LOW (ref 36.0–46.0)
HEMOGLOBIN: 10.2 g/dL — AB (ref 12.0–15.0)
Lymphocytes Relative: 4 % — ABNORMAL LOW (ref 12–46)
Lymphs Abs: 0.8 10*3/uL (ref 0.7–4.0)
MCH: 28.5 pg (ref 26.0–34.0)
MCHC: 34.2 g/dL (ref 30.0–36.0)
MCV: 83.2 fL (ref 78.0–100.0)
MONO ABS: 2.5 10*3/uL — AB (ref 0.1–1.0)
MONOS PCT: 11 % (ref 3–12)
NEUTROS ABS: 19.4 10*3/uL — AB (ref 1.7–7.7)
NEUTROS PCT: 86 % — AB (ref 43–77)
Platelets: 148 10*3/uL — ABNORMAL LOW (ref 150–400)
RBC: 3.58 MIL/uL — ABNORMAL LOW (ref 3.87–5.11)
RDW: 15.1 % (ref 11.5–15.5)
WBC: 22.7 10*3/uL — ABNORMAL HIGH (ref 4.0–10.5)

## 2014-12-12 LAB — URINE MICROSCOPIC-ADD ON

## 2014-12-12 MED ORDER — ZOLPIDEM TARTRATE 5 MG PO TABS
5.0000 mg | ORAL_TABLET | Freq: Every evening | ORAL | Status: DC | PRN
  Administered 2014-12-13: 5 mg via ORAL
  Filled 2014-12-12 (×2): qty 1

## 2014-12-12 MED ORDER — PROMETHAZINE HCL 25 MG RE SUPP
25.0000 mg | Freq: Four times a day (QID) | RECTAL | Status: DC | PRN
  Filled 2014-12-12: qty 1

## 2014-12-12 MED ORDER — CALCIUM CARBONATE ANTACID 500 MG PO CHEW: 2.0000 | CHEWABLE_TABLET | ORAL | Status: DC | PRN

## 2014-12-12 MED ORDER — PROMETHAZINE HCL 25 MG/ML IJ SOLN: 25.0000 mg | Freq: Four times a day (QID) | INTRAMUSCULAR | Status: DC | PRN

## 2014-12-12 MED ORDER — PROMETHAZINE HCL 25 MG PO TABS: 25.0000 mg | ORAL_TABLET | Freq: Four times a day (QID) | ORAL | Status: DC | PRN

## 2014-12-12 MED ORDER — CEFAZOLIN SODIUM-DEXTROSE 2-3 GM-% IV SOLR
2.0000 g | Freq: Three times a day (TID) | INTRAVENOUS | Status: DC
  Administered 2014-12-12 – 2014-12-13 (×3): 2 g via INTRAVENOUS
  Filled 2014-12-12 (×4): qty 50

## 2014-12-12 MED ORDER — ACETAMINOPHEN 325 MG PO TABS
650.0000 mg | ORAL_TABLET | ORAL | Status: DC | PRN
Start: 2014-12-12 — End: 2014-12-19
  Administered 2014-12-12 – 2014-12-16 (×16): 650 mg via ORAL
  Filled 2014-12-12 (×17): qty 2

## 2014-12-12 MED ORDER — SODIUM CHLORIDE 0.9 % IV SOLN
INTRAVENOUS | Status: DC
  Administered 2014-12-12: 21:00:00 via INTRAVENOUS
  Administered 2014-12-13: 125 mL/h via INTRAVENOUS
  Administered 2014-12-13 – 2014-12-14 (×4): via INTRAVENOUS
  Administered 2014-12-14: 500 mL/h via INTRAVENOUS
  Administered 2014-12-15 – 2014-12-18 (×9): via INTRAVENOUS

## 2014-12-12 MED ORDER — PRENATAL MULTIVITAMIN CH
1.0000 | ORAL_TABLET | Freq: Every day | ORAL | Status: DC
  Administered 2014-12-13 – 2014-12-18 (×6): 1 via ORAL
  Filled 2014-12-12 (×6): qty 1

## 2014-12-12 MED ORDER — DOCUSATE SODIUM 100 MG PO CAPS
100.0000 mg | ORAL_CAPSULE | Freq: Every day | ORAL | Status: DC
  Administered 2014-12-13 – 2014-12-18 (×6): 100 mg via ORAL
  Filled 2014-12-12 (×6): qty 1

## 2014-12-12 NOTE — H&P (Signed)
Please see NP note for full details:  Briefly, this is a 24 y.o. G1P0 [redacted]w[redacted]d who presented yesterday for low back pain and nausea/vomiting.  She was found to have a UTI and was treated with Macrobid.  However, today she spiked a fever to 102 at home and c/o flank pain.  She reports good FM and no LOF or contractions.  Denies flu contact.  Filed Vitals:   12/12/14 1926 12/12/14 2010 12/12/14 2055 12/12/14 2130  BP: 116/70   123/77  Pulse: 127   102  Temp: 99 F (37.2 C) 97.1 F (36.2 C) 98.7 F (37.1 C) 99.7 F (37.6 C)  TempSrc: Oral  Oral Oral  Resp: 18   16  Height:    5\' 5"  (1.651 m)  Weight:    80.74 kg (178 lb)  SpO2:    98%  Lungs CTA, +CVAT  Cor RRR, slightly tachy  Abd soft, gravid.  Ex no cords.  FHTs  140-150 with gSTV, NST R and cat 1 tracing Toco occ.  Results for orders placed or performed during the hospital encounter of 12/12/14 (from the past 24 hour(s))  Urinalysis, Routine w reflex microscopic (not at [facility])     Status: Abnormal   Collection Time: 12/12/14  7:15 PM  Result Value Ref Range   Color, Urine YELLOW YELLOW   APPearance CLEAR CLEAR   Specific Gravity, Urine 1.020 1.005 - 1.030   pH 6.0 5.0 - 8.0   Glucose, UA 500 (A) NEGATIVE mg/dL   Hgb urine dipstick MODERATE (A) NEGATIVE   Bilirubin Urine NEGATIVE NEGATIVE   Ketones, ur NEGATIVE NEGATIVE mg/dL   Protein, ur 161 (A) NEGATIVE mg/dL   Urobilinogen, UA 0.2 0.0 - 1.0 mg/dL   Nitrite NEGATIVE NEGATIVE   Leukocytes, UA SMALL (A) NEGATIVE  Urine microscopic-add on     Status: Abnormal   Collection Time: 12/12/14  7:15 PM  Result Value Ref Range   Squamous Epithelial / LPF FEW (A) RARE   WBC, UA 7-10 <3 WBC/hpf   RBC / HPF 11-20 <3 RBC/hpf   Bacteria, UA FEW (A) RARE  CBC with Differential     Status: Abnormal   Collection Time: 12/12/14  8:05 PM  Result Value Ref Range   WBC 22.7 (H) 4.0 - 10.5 K/uL   RBC 3.58 (L) 3.87 - 5.11 MIL/uL   Hemoglobin 10.2 (L) 12.0 - 15.0 g/dL   HCT 09.6 (L)  04.5 - 46.0 %   MCV 83.2 78.0 - 100.0 fL   MCH 28.5 26.0 - 34.0 pg   MCHC 34.2 30.0 - 36.0 g/dL   RDW 40.9 81.1 - 91.4 %   Platelets 148 (L) 150 - 400 K/uL   Neutrophils Relative % 86 (H) 43 - 77 %   Neutro Abs 19.4 (H) 1.7 - 7.7 K/uL   Lymphocytes Relative 4 (L) 12 - 46 %   Lymphs Abs 0.8 0.7 - 4.0 K/uL   Monocytes Relative 11 3 - 12 %   Monocytes Absolute 2.5 (H) 0.1 - 1.0 K/uL   Eosinophils Relative 0 0 - 5 %   Eosinophils Absolute 0.0 0.0 - 0.7 K/uL   Basophils Relative 0 0 - 1 %   Basophils Absolute 0.0 0.0 - 0.1 K/uL  Comprehensive metabolic panel     Status: Abnormal   Collection Time: 12/12/14  8:05 PM  Result Value Ref Range   Sodium 132 (L) 135 - 145 mmol/L   Potassium 3.7 3.5 - 5.1 mmol/L  Chloride 105 101 - 111 mmol/L   CO2 20 (L) 22 - 32 mmol/L   Glucose, Bld 146 (H) 65 - 99 mg/dL   BUN 7 6 - 20 mg/dL   Creatinine, Ser 1.61 0.44 - 1.00 mg/dL   Calcium 8.8 (L) 8.9 - 10.3 mg/dL   Total Protein 6.4 (L) 6.5 - 8.1 g/dL   Albumin 2.9 (L) 3.5 - 5.0 g/dL   AST 22 15 - 41 U/L   ALT 16 14 - 54 U/L   Alkaline Phosphatase 105 38 - 126 U/L   Total Bilirubin 0.3 0.3 - 1.2 mg/dL   GFR calc non Af Amer >60 >60 mL/min   GFR calc Af Amer >60 >60 mL/min   Anion gap 7 5 - 15    Admit for IV ancef.  Urine culture pending.  Blood cultures if spikes again above or equal to 101.  Faylinn Schwenn A   HPI: Angelica Hester is a 24 y.o. G1P0 at [redacted]w[redacted]d who presents to maternity admissions reporting right side pain today, fever 102 and chills immediately before coming to MAU. Was seen in MAU yesterday, Dx w/ UTI, Rx Macrobid. Taking as directed. Rates pain 8/10 on pain scale, constant, aching, no relationship to position. Hasn't tried anything for pain.   Denies contractions, leakage of fluid or vaginal bleeding. Good fetal movement.   Past Medical History: Past Medical History  Diagnosis Date  . Anemia   . Chlamydia     Past obstetric history: OB History  Gravida  Para Term Preterm AB SAB TAB Ectopic Multiple Living  1             # Outcome Date GA Lbr Len/2nd Weight Sex Delivery Anes PTL Lv  1 Current               Past Surgical History: Past Surgical History  Procedure Laterality Date  . No past surgeries      Family History: Family History  Problem Relation Age of Onset  . Asthma Mother   . Asthma Brother     Social History: History  Substance Use Topics  . Smoking status: Never Smoker   . Smokeless tobacco: Not on file  . Alcohol Use: No    Allergies: No Known Allergies  Meds:  Prescriptions prior to admission  Medication Sig Dispense Refill Last Dose  . [DISCONTINUED] acetaminophen (TYLENOL) 500 MG tablet Take 500 mg by mouth every 6 (six) hours as needed for moderate pain or headache.   12/11/2014 at Unknown time  . [DISCONTINUED] calcium carbonate (TUMS - DOSED IN MG ELEMENTAL CALCIUM) 500 MG chewable tablet Chew 1 tablet by mouth daily.   12/11/2014 at Unknown time  . [DISCONTINUED] nitrofurantoin, macrocrystal-monohydrate, (MACROBID) 100 MG capsule Take 1 capsule (100 mg total) by mouth 2 (two) times daily. 14 capsule 1 12/12/2014 at 1200  . [DISCONTINUED] Prenatal Vit-Fe Fumarate-FA (PRENATAL MULTIVITAMIN) TABS tablet Take 1 tablet by mouth daily at 12 noon.   Past Week at Unknown time  . [DISCONTINUED] promethazine (PHENERGAN) 25 MG tablet Take 1 tablet (25 mg total) by mouth every 6 (six) hours as needed for nausea or vomiting. 15 tablet 0 12/11/2014 at Unknown time    ROS:  Review of Systems  Constitutional: Positive for fever, chills and malaise/fatigue.  HENT: Negative for congestion and sore throat.  Respiratory: Negative for cough.  Gastrointestinal: Positive for nausea. Negative for vomiting and abdominal pain.  Genitourinary: Positive for frequency and flank pain. Negative for dysuria, urgency and  hematuria.   Neg for contractions, VB, LOF.  Musculoskeletal: Negative for myalgias.  Neurological: Positive for weakness. Negative for headaches.    Physical Exam  Blood pressure 123/77, pulse 102, temperature 99.7 F (37.6 C), temperature source Oral, resp. rate 16, last menstrual period 06/07/2014, SpO2 98 %.  Patient Vitals for the past 24 hrs:  BP Temp Temp src Pulse Resp SpO2  12/12/14 2130 123/77 mmHg 99.7 F (37.6 C) Oral (!) 102 16 98 %  12/12/14 2055 - 98.7 F (37.1 C) Oral - - -  12/12/14 2010 - 97.1 F (36.2 C) - - - -  12/12/14 1926 116/70 mmHg 99 F (37.2 C) Oral (!) 127 18 -    GENERAL: Well-developed, well-nourished female in mild acute distress.  HEART: Tachycardic, regular rhythm.  RESP: normal effort and RR. CTAB GI: Abd soft, non-tender, gravid appropriate for gestational age.  MS: Extremities nontender, no pedal edema, normal ROM of back NEURO: Alert and oriented x 4.  GU: Right CVAT    FHT: Baseline 165 initially, decreased spontaneously to 150's, moderate variability, accelerations present, no decelerations Contractions: UI  Labs:  Lab Results Last 24 Hours    Results for orders placed or performed during the hospital encounter of 12/12/14 (from the past 24 hour(s))  Urinalysis, Routine w reflex microscopic (not at [facility]) Status: Abnormal   Collection Time: 12/12/14 7:15 PM  Result Value Ref Range   Color, Urine YELLOW YELLOW   APPearance CLEAR CLEAR   Specific Gravity, Urine 1.020 1.005 - 1.030   pH 6.0 5.0 - 8.0   Glucose, UA 500 (A) NEGATIVE mg/dL   Hgb urine dipstick MODERATE (A) NEGATIVE   Bilirubin Urine NEGATIVE NEGATIVE   Ketones, ur NEGATIVE NEGATIVE mg/dL   Protein, ur 161 (A) NEGATIVE mg/dL   Urobilinogen, UA 0.2 0.0 - 1.0 mg/dL   Nitrite NEGATIVE NEGATIVE   Leukocytes, UA SMALL (A) NEGATIVE  Urine microscopic-add on  Status: Abnormal   Collection Time: 12/12/14 7:15 PM  Result Value Ref Range   Squamous Epithelial / LPF FEW (A) RARE   WBC, UA 7-10 <3 WBC/hpf   RBC / HPF 11-20 <3 RBC/hpf   Bacteria, UA FEW (A) RARE  CBC with Differential Status: Abnormal   Collection Time: 12/12/14 8:05 PM  Result Value Ref Range   WBC 22.7 (H) 4.0 - 10.5 K/uL   RBC 3.58 (L) 3.87 - 5.11 MIL/uL   Hemoglobin 10.2 (L) 12.0 - 15.0 g/dL   HCT 09.6 (L) 04.5 - 40.9 %   MCV 83.2 78.0 - 100.0 fL   MCH 28.5 26.0 - 34.0 pg   MCHC 34.2 30.0 - 36.0 g/dL   RDW 81.1 91.4 - 78.2 %   Platelets 148 (L) 150 - 400 K/uL   Neutrophils Relative % 86 (H) 43 - 77 %   Neutro Abs 19.4 (H) 1.7 - 7.7 K/uL   Lymphocytes Relative 4 (L) 12 - 46 %   Lymphs Abs 0.8 0.7 - 4.0 K/uL   Monocytes Relative 11 3 - 12 %   Monocytes Absolute 2.5 (H) 0.1 - 1.0 K/uL   Eosinophils Relative 0 0 - 5 %   Eosinophils Absolute 0.0 0.0 - 0.7 K/uL   Basophils Relative 0 0 - 1 %   Basophils Absolute 0.0 0.0 - 0.1 K/uL  Comprehensive metabolic panel Status: Abnormal   Collection Time: 12/12/14 8:05 PM  Result Value Ref Range   Sodium 132 (L) 135 - 145 mmol/L   Potassium 3.7 3.5 -  5.1 mmol/L   Chloride 105 101 - 111 mmol/L   CO2 20 (L) 22 - 32 mmol/L   Glucose, Bld 146 (H) 65 - 99 mg/dL   BUN 7 6 - 20 mg/dL   Creatinine, Ser 1.610.79 0.44 - 1.00 mg/dL   Calcium 8.8 (L) 8.9 - 10.3 mg/dL   Total Protein 6.4 (L) 6.5 - 8.1 g/dL   Albumin 2.9 (L) 3.5 - 5.0 g/dL   AST 22 15 - 41 U/L   ALT 16 14 - 54 U/L   Alkaline Phosphatase 105 38 - 126 U/L   Total Bilirubin 0.3 0.3 - 1.2 mg/dL   GFR calc non Af Amer >60 >60 mL/min   GFR calc Af Amer >60 >60 mL/min   Anion gap 7 5 - 15      Imaging:  NA  MAU Course: CBC, CMET, IV bolus. Dr. Henderson CloudHorvath notified of Sx, CVAT, Initial fetal  tachycardia, resolved.   Assessment: 1. Pyelonephritis affecting pregnancy in third trimester, antepartum     Plan: Admit for IV Ancef per Consult w/ Dr. Henderson CloudHorvath.  Urine culture pending from maternity admissions visit yesterday.

## 2014-12-12 NOTE — MAU Note (Signed)
Pt sent in MAU yesterday, dx'd with UTI, put on Macrobid.  Pt has taken one dose so far today.  Pt had chills after leaving MAU last night, continued today, temp was 102 @ 1800 tonight.  Denies vomiting or diarrhea, having R flank pain.  Denies uc's, bleeding or LOF.

## 2014-12-12 NOTE — MAU Provider Note (Signed)
Chief Complaint:  Fever and Back Pain   First Provider Initiated Contact with Patient 12/12/14 2002      HPI: Angelica Hester is a 24 y.o. G1P0 at [redacted]w[redacted]d who presents to maternity admissions reporting right side pain today, fever 102 and chills immediately before coming to MAU. Was seen in MAU yesterday, Dx w/ UTI, Rx Macrobid. Taking as directed. Rates pain 8/10 on pain scale, constant, aching, no relationship to position. Hasn't tried anything for pain.   Denies contractions, leakage of fluid or vaginal bleeding. Good fetal movement.   Past Medical History: Past Medical History  Diagnosis Date  . Anemia   . Chlamydia     Past obstetric history: OB History  Gravida Para Term Preterm AB SAB TAB Ectopic Multiple Living  1             # Outcome Date GA Lbr Len/2nd Weight Sex Delivery Anes PTL Lv  1 Current               Past Surgical History: Past Surgical History  Procedure Laterality Date  . No past surgeries       Family History: Family History  Problem Relation Age of Onset  . Asthma Mother   . Asthma Brother     Social History: History  Substance Use Topics  . Smoking status: Never Smoker   . Smokeless tobacco: Not on file  . Alcohol Use: No    Allergies: No Known Allergies  Meds:  Prescriptions prior to admission  Medication Sig Dispense Refill Last Dose  . [DISCONTINUED] acetaminophen (TYLENOL) 500 MG tablet Take 500 mg by mouth every 6 (six) hours as needed for moderate pain or headache.   12/11/2014 at Unknown time  . [DISCONTINUED] calcium carbonate (TUMS - DOSED IN MG ELEMENTAL CALCIUM) 500 MG chewable tablet Chew 1 tablet by mouth daily.   12/11/2014 at Unknown time  . [DISCONTINUED] nitrofurantoin, macrocrystal-monohydrate, (MACROBID) 100 MG capsule Take 1 capsule (100 mg total) by mouth 2 (two) times daily. 14 capsule 1 12/12/2014 at 1200  . [DISCONTINUED] Prenatal Vit-Fe Fumarate-FA (PRENATAL MULTIVITAMIN) TABS tablet Take 1 tablet by mouth daily at 12  noon.   Past Week at Unknown time  . [DISCONTINUED] promethazine (PHENERGAN) 25 MG tablet Take 1 tablet (25 mg total) by mouth every 6 (six) hours as needed for nausea or vomiting. 15 tablet 0 12/11/2014 at Unknown time    ROS:  Review of Systems  Constitutional: Positive for fever, chills and malaise/fatigue.  HENT: Negative for congestion and sore throat.   Respiratory: Negative for cough.   Gastrointestinal: Positive for nausea. Negative for vomiting and abdominal pain.  Genitourinary: Positive for frequency and flank pain. Negative for dysuria, urgency and hematuria.       Neg for contractions, VB, LOF.  Musculoskeletal: Negative for myalgias.  Neurological: Positive for weakness. Negative for headaches.    Physical Exam  Blood pressure 123/77, pulse 102, temperature 99.7 F (37.6 C), temperature source Oral, resp. rate 16, last menstrual period 06/07/2014, SpO2 98 %.  Patient Vitals for the past 24 hrs:  BP Temp Temp src Pulse Resp SpO2  12/12/14 2130 123/77 mmHg 99.7 F (37.6 C) Oral (!) 102 16 98 %  12/12/14 2055 - 98.7 F (37.1 C) Oral - - -  12/12/14 2010 - 97.1 F (36.2 C) - - - -  12/12/14 1926 116/70 mmHg 99 F (37.2 C) Oral (!) 127 18 -    GENERAL: Well-developed, well-nourished female in mild acute distress.  HEART: Tachycardic, regular rhythm.  RESP: normal effort and RR. CTAB GI: Abd soft, non-tender, gravid appropriate for gestational age.  MS: Extremities nontender, no pedal edema, normal ROM of back NEURO: Alert and oriented x 4.  GU: Right CVAT    FHT:  Baseline 165 initially, decreased spontaneously to 150's, moderate variability, accelerations present, no decelerations Contractions: UI   Labs: Results for orders placed or performed during the hospital encounter of 12/12/14 (from the past 24 hour(s))  Urinalysis, Routine w reflex microscopic (not at [facility])     Status: Abnormal   Collection Time: 12/12/14  7:15 PM  Result Value Ref Range   Color,  Urine YELLOW YELLOW   APPearance CLEAR CLEAR   Specific Gravity, Urine 1.020 1.005 - 1.030   pH 6.0 5.0 - 8.0   Glucose, UA 500 (A) NEGATIVE mg/dL   Hgb urine dipstick MODERATE (A) NEGATIVE   Bilirubin Urine NEGATIVE NEGATIVE   Ketones, ur NEGATIVE NEGATIVE mg/dL   Protein, ur 098100 (A) NEGATIVE mg/dL   Urobilinogen, UA 0.2 0.0 - 1.0 mg/dL   Nitrite NEGATIVE NEGATIVE   Leukocytes, UA SMALL (A) NEGATIVE  Urine microscopic-add on     Status: Abnormal   Collection Time: 12/12/14  7:15 PM  Result Value Ref Range   Squamous Epithelial / LPF FEW (A) RARE   WBC, UA 7-10 <3 WBC/hpf   RBC / HPF 11-20 <3 RBC/hpf   Bacteria, UA FEW (A) RARE  CBC with Differential     Status: Abnormal   Collection Time: 12/12/14  8:05 PM  Result Value Ref Range   WBC 22.7 (H) 4.0 - 10.5 K/uL   RBC 3.58 (L) 3.87 - 5.11 MIL/uL   Hemoglobin 10.2 (L) 12.0 - 15.0 g/dL   HCT 11.929.8 (L) 14.736.0 - 82.946.0 %   MCV 83.2 78.0 - 100.0 fL   MCH 28.5 26.0 - 34.0 pg   MCHC 34.2 30.0 - 36.0 g/dL   RDW 56.215.1 13.011.5 - 86.515.5 %   Platelets 148 (L) 150 - 400 K/uL   Neutrophils Relative % 86 (H) 43 - 77 %   Neutro Abs 19.4 (H) 1.7 - 7.7 K/uL   Lymphocytes Relative 4 (L) 12 - 46 %   Lymphs Abs 0.8 0.7 - 4.0 K/uL   Monocytes Relative 11 3 - 12 %   Monocytes Absolute 2.5 (H) 0.1 - 1.0 K/uL   Eosinophils Relative 0 0 - 5 %   Eosinophils Absolute 0.0 0.0 - 0.7 K/uL   Basophils Relative 0 0 - 1 %   Basophils Absolute 0.0 0.0 - 0.1 K/uL  Comprehensive metabolic panel     Status: Abnormal   Collection Time: 12/12/14  8:05 PM  Result Value Ref Range   Sodium 132 (L) 135 - 145 mmol/L   Potassium 3.7 3.5 - 5.1 mmol/L   Chloride 105 101 - 111 mmol/L   CO2 20 (L) 22 - 32 mmol/L   Glucose, Bld 146 (H) 65 - 99 mg/dL   BUN 7 6 - 20 mg/dL   Creatinine, Ser 7.840.79 0.44 - 1.00 mg/dL   Calcium 8.8 (L) 8.9 - 10.3 mg/dL   Total Protein 6.4 (L) 6.5 - 8.1 g/dL   Albumin 2.9 (L) 3.5 - 5.0 g/dL   AST 22 15 - 41 U/L   ALT 16 14 - 54 U/L   Alkaline  Phosphatase 105 38 - 126 U/L   Total Bilirubin 0.3 0.3 - 1.2 mg/dL   GFR calc non Af Amer >60 >60  mL/min   GFR calc Af Amer >60 >60 mL/min   Anion gap 7 5 - 15    Imaging:  NA  MAU Course: CBC, CMET, IV bolus. Dr. Henderson Cloud notified of Sx, CVAT, Initial fetal tachycardia, resolved.   Assessment: 1. Pyelonephritis affecting pregnancy in third trimester, antepartum     Plan: Admit for IV Ancef per Consult w/ Dr. Henderson Cloud.  Urine culture pending from maternity admissions visit yesterday.  [place], CNM 12/12/2014 10:30 PM

## 2014-12-13 LAB — MRSA PCR SCREENING: MRSA by PCR: NEGATIVE

## 2014-12-13 MED ORDER — ACETAMINOPHEN 325 MG PO TABS
650.0000 mg | ORAL_TABLET | Freq: Once | ORAL | Status: AC
  Administered 2014-12-13: 650 mg via ORAL

## 2014-12-13 MED ORDER — SODIUM CHLORIDE 0.9 % IV BOLUS (SEPSIS)
1000.0000 mL | Freq: Once | INTRAVENOUS | Status: AC
  Administered 2014-12-13: 1000 mL via INTRAVENOUS

## 2014-12-13 MED ORDER — ACETAMINOPHEN 325 MG PO TABS
325.0000 mg | ORAL_TABLET | Freq: Once | ORAL | Status: AC
  Administered 2014-12-13: 325 mg via ORAL
  Filled 2014-12-13: qty 1

## 2014-12-13 MED ORDER — CEFTRIAXONE SODIUM IN DEXTROSE 20 MG/ML IV SOLN
1.0000 g | Freq: Two times a day (BID) | INTRAVENOUS | Status: DC
  Administered 2014-12-13 – 2014-12-16 (×6): 1 g via INTRAVENOUS
  Filled 2014-12-13 (×7): qty 50

## 2014-12-13 MED ORDER — OXYCODONE HCL 5 MG PO TABS
5.0000 mg | ORAL_TABLET | ORAL | Status: DC | PRN
Start: 2014-12-13 — End: 2014-12-19
  Administered 2014-12-14 – 2014-12-16 (×12): 5 mg via ORAL
  Filled 2014-12-13 (×12): qty 1

## 2014-12-13 NOTE — Progress Notes (Signed)
Pt seen and examined.   Resting comfortably.  Reports no pain while at rest.  Significant right flank pain w movement.   Denies fevers/chills at this time.  EFM: 140s, mod var, + accels--fetal tachycardia resolved w resolution of maternal fever Toco: irregular contractions, now quiet, BP 113/69 mmHg  Pulse 102  Temp(Src) 99.3 F (37.4 C) (Oral)  Resp 20  Ht 5\' 5"  (1.651 m)  Wt 80.173 kg (176 lb 12 oz)  BMI 29.41 kg/m2  SpO2 100%  LMP 06/07/2014   NAD Abd: soft, non-tender Back: continued right CVA tenderness  UCx: gram neg rods.  I spoke w micro--final determination of species will not be back until tomorrow.  Antibiotic sensitivities an additional 24-48 hrs later.  I spoke w pharmacy and asked them to review our current antibiograms w presumptive diagnosis of e.coli vs klebsiella.  Our data show 95-96% sensitivity to ancef.  100% of our e.coli utis are sensitive to rocephin, and 99% of klebsiella are sensitive.  Will switch to rocephin 1gm q12h at this time.    Review of current recommendations for pyelonephritis would recommend renal imaging if no improvement 48-72h after initiation of antibiotic therapy.  Will defer renal US at this time.  Will continue to treat symptomatically w tylenol prn for fever, oxycodone prn for pain.   Repeat NST tonight.

## 2014-12-13 NOTE — Progress Notes (Signed)
1327 - RR RN called to evaluate patient.  Patient resting in bed.  A&O.  Patient lying in bed with blanket covering her up to shoulders.  Patient reports having chills.  Lungs clear.  VS BP 144/81, pulse 132, resp 26, Os sats 97%RA.  Patient c/o pain in right lower back 7 of 10.  States increases to a 8 of 10 with movement.  Patient's RN notifying MD.

## 2014-12-13 NOTE — Progress Notes (Signed)
This note also relates to the following rows which could not be included: BP - Cannot attach notes to unvalidated device data Pulse Rate - Cannot attach notes to unvalidated device data SpO2 - Cannot attach notes to unvalidated device data   RROB spoke with Dr Chestine Sporelark after she reviewed the strip; Dr Chestine Sporelark said that pt may come off of continuous monitoring and have NST TID and if strip is not a category one when do nst, then need to keep monitoring until it becomes a category 1; and notify Dr if not a category 1 strip.

## 2014-12-13 NOTE — Progress Notes (Signed)
Temp 102.9, pulse 114.  Blood cultures drawn as ordered per Dr. Henderson CloudHorvath.  Patient c/o of headache, Tylenol 650mg  po given.

## 2014-12-13 NOTE — Progress Notes (Signed)
RROB notified Dr Chestine Sporelark of fhr tachy 180-190's for the past 20 minutes with accels, no decels and moderate variability. Told of pt with temp of 99.5, but is shakey and cold and maybe temp is quickly rising. RROB asked if pt may be given tylenol to see if it would bring fhr down to normal range. Thayer Ohmhris, RN on women's unit also spoke with Dr Chestine Sporelark and told of her I/O, v/s; orders received to give 650mg  of tylenol, 1000ml fluid bolus, and for baby to be continuously monitored until fhr is no higher than 165.

## 2014-12-13 NOTE — Progress Notes (Signed)
24 y.o. G1P0 [redacted]w[redacted]d HD#1 admitted for 31 WEEKS,FEVER AN CHILLS, suspected pyelonephritis  Pt currently stable with no c/o contractions/vb.  Good FM.  Does have continued right flank pain and headache.  Tmax overnight 102.9 @ 0250.    BP 123/70 mmHg  Pulse 110  Temp(Src) 99.6 F (37.6 C) (Oral)  Resp 18  Ht  (1.651 m)  Wt 80.173 kg (176 lb 12 oz)  BMI 29.41 kg/m2  SpO2 100%  LMP 06/07/2014   Lungs CTA Cardiac: Reg rhythm, mild tachycardia in 110s Abd  Soft, gravid, nontender Back: right CVA tenderness, no Left CVA tenderness Ex no edema FHTs AM NST pending  Results for orders placed or performed during the hospital encounter of 12/12/14 (from the past 24 hour(s))  Urinalysis, Routine w reflex microscopic (not at [facility])     Status: Abnormal   Collection Time: 12/12/14  7:15 PM  Result Value Ref Range   Color, Urine YELLOW YELLOW   APPearance CLEAR CLEAR   Specific Gravity, Urine 1.020 1.005 - 1.030   pH 6.0 5.0 - 8.0   Glucose, UA 500 (A) NEGATIVE mg/dL   Hgb urine dipstick MODERATE (A) NEGATIVE   Bilirubin Urine NEGATIVE NEGATIVE   Ketones, ur NEGATIVE NEGATIVE mg/dL   Protein, ur 161 (A) NEGATIVE mg/dL   Urobilinogen, UA 0.2 0.0 - 1.0 mg/dL   Nitrite NEGATIVE NEGATIVE   Leukocytes, UA SMALL (A) NEGATIVE  Urine microscopic-add on     Status: Abnormal   Collection Time: 12/12/14  7:15 PM  Result Value Ref Range   Squamous Epithelial / LPF FEW (A) RARE   WBC, UA 7-10 <3 WBC/hpf   RBC / HPF 11-20 <3 RBC/hpf   Bacteria, UA FEW (A) RARE  CBC with Differential     Status: Abnormal   Collection Time: 12/12/14  8:05 PM  Result Value Ref Range   WBC 22.7 (H) 4.0 - 10.5 K/uL   RBC 3.58 (L) 3.87 - 5.11 MIL/uL   Hemoglobin 10.2 (L) 12.0 - 15.0 g/dL   HCT 09.6 (L) 04.5 - 40.9 %   MCV 83.2 78.0 - 100.0 fL   MCH 28.5 26.0 - 34.0 pg   MCHC 34.2 30.0 - 36.0 g/dL   RDW 81.1 91.4 - 78.2 %   Platelets 148 (L) 150 - 400 K/uL   Neutrophils Relative % 86 (H) 43 - 77 %   Neutro  Abs 19.4 (H) 1.7 - 7.7 K/uL   Lymphocytes Relative 4 (L) 12 - 46 %   Lymphs Abs 0.8 0.7 - 4.0 K/uL   Monocytes Relative 11 3 - 12 %   Monocytes Absolute 2.5 (H) 0.1 - 1.0 K/uL   Eosinophils Relative 0 0 - 5 %   Eosinophils Absolute 0.0 0.0 - 0.7 K/uL   Basophils Relative 0 0 - 1 %   Basophils Absolute 0.0 0.0 - 0.1 K/uL  Comprehensive metabolic panel     Status: Abnormal   Collection Time: 12/12/14  8:05 PM  Result Value Ref Range   Sodium 132 (L) 135 - 145 mmol/L   Potassium 3.7 3.5 - 5.1 mmol/L   Chloride 105 101 - 111 mmol/L   CO2 20 (L) 22 - 32 mmol/L   Glucose, Bld 146 (H) 65 - 99 mg/dL   BUN 7 6 - 20 mg/dL   Creatinine, Ser 9.56 0.44 - 1.00 mg/dL   Calcium 8.8 (L) 8.9 - 10.3 mg/dL   Total Protein 6.4 (L) 6.5 - 8.1 g/dL   Albumin  2.9 (L) 3.5 - 5.0 g/dL   AST 22 15 - 41 U/L   ALT 16 14 - 54 U/L   Alkaline Phosphatase 105 38 - 126 U/L   Total Bilirubin 0.3 0.3 - 1.2 mg/dL   GFR calc non Af Amer >60 >60 mL/min   GFR calc Af Amer >60 >60 mL/min   Anion gap 7 5 - 15    A:  HD#1  [id number] with pyelonephritis.  P: Urine culture pending from 6/27.  Blood cultures collected on admission.  Febrile overnight, but had only received one dose of antibiotics.  I do not suspect bacteremia at this time. Cont IV ancef while awaiting urine culture.  Tylenol prn for HA/back pain.   Daily NST is pending.  No s/sx PTL or pulmonary edema  Marshawn Normoyle GEFFEL Adith Tejada

## 2014-12-13 NOTE — Progress Notes (Signed)
Pt transferred to AICU   For monitoring   Pt not a candidate for  This on this UNIT   RAPID RESPONSE RN WITH PT

## 2014-12-13 NOTE — Progress Notes (Signed)
Late Entry  I was alerted by rapid response OB RN of fetal tachycardia.  Patient noted to be febrile at 38.9C w P 120s.   Fetus initially w FHR 150s, mod var, now tachy in the 180s, still w moderate variability, no decels  Tylenol, IVF bolus now.  Continuous fetal monitoring while tachycardic.

## 2014-12-14 ENCOUNTER — Inpatient Hospital Stay (HOSPITAL_COMMUNITY): Payer: No Typology Code available for payment source

## 2014-12-14 LAB — CBC WITH DIFFERENTIAL/PLATELET
BASOS PCT: 0 % (ref 0–1)
Basophils Absolute: 0 10*3/uL (ref 0.0–0.1)
EOS ABS: 0 10*3/uL (ref 0.0–0.7)
EOS PCT: 0 % (ref 0–5)
HCT: 30.6 % — ABNORMAL LOW (ref 36.0–46.0)
HEMOGLOBIN: 10.4 g/dL — AB (ref 12.0–15.0)
LYMPHS PCT: 5 % — AB (ref 12–46)
Lymphs Abs: 0.7 10*3/uL (ref 0.7–4.0)
MCH: 28.7 pg (ref 26.0–34.0)
MCHC: 34 g/dL (ref 30.0–36.0)
MCV: 84.3 fL (ref 78.0–100.0)
MONO ABS: 0.6 10*3/uL (ref 0.1–1.0)
Monocytes Relative: 5 % (ref 3–12)
NEUTROS ABS: 11.2 10*3/uL — AB (ref 1.7–7.7)
Neutrophils Relative %: 90 % — ABNORMAL HIGH (ref 43–77)
Platelets: 149 10*3/uL — ABNORMAL LOW (ref 150–400)
RBC: 3.63 MIL/uL — AB (ref 3.87–5.11)
RDW: 15.6 % — ABNORMAL HIGH (ref 11.5–15.5)
WBC: 12.5 10*3/uL — ABNORMAL HIGH (ref 4.0–10.5)

## 2014-12-14 LAB — CULTURE, OB URINE
Culture: >=100000
SPECIAL REQUESTS: NORMAL

## 2014-12-14 MED ORDER — SODIUM CHLORIDE 0.9 % IV BOLUS (SEPSIS)
500.0000 mL | Freq: Once | INTRAVENOUS | Status: AC
  Administered 2014-12-14: 500 mL via INTRAVENOUS

## 2014-12-14 NOTE — Progress Notes (Signed)
My visit with Angelica Hester was short as she was having lunch when I arrived. She said she was doing good and getting better. Her face lit up as she talked about the upcoming birth of her son.  Chaplain Elmarie Shileyamela Carrington Holder   12/14/14 1100  Clinical Encounter Type  Visited With Patient

## 2014-12-14 NOTE — Progress Notes (Addendum)
Patient ID: Angelica Hester, female   DOB: [birth year], 24 y.o.   MRN: [id number]  HD#3 24 yo G1 @ 31+3 with pyelo  Patient continues to spike temps after being on 24 hours of antibiotics. Urine culture returned gram negative rods. Antibiotics were changed to ceftriaxone to broaden coverage. Final culture and sensitivities have now returned and show ecoli that is pan sensitive. Given appropriate antibiotic coverage over the last 48 hours with failure to defervesce will proceed with renal ultrasound  Addendum: Renal Ultrasound WNL

## 2014-12-14 NOTE — Progress Notes (Signed)
Patient ID: Angelica Hester, female   DOB: [birth year], 24 y.o.   MRN: [id number]   S: Pt feeling better O:  Filed Vitals:   12/14/14 1200 12/14/14 1506 12/14/14 1613 12/14/14 1707  BP:  128/81    Pulse:  116    Temp: 98.4 F (36.9 C) 99.9 F (37.7 C) 100.7 F (38.2 C) 102 F (38.9 C)  TempSrc: Oral  Oral   Resp:      Height:      Weight:      SpO2:  97%     AOX3 Abd soft NT/ND Cvx deferred NST reactive,   Results for orders placed or performed during the hospital encounter of 12/12/14 (from the past 24 hour(s))  CBC with Differential/Platelet     Status: Abnormal   Collection Time: 12/14/14  2:32 PM  Result Value Ref Range   WBC 12.5 (H) 4.0 - 10.5 K/uL   RBC 3.63 (L) 3.87 - 5.11 MIL/uL   Hemoglobin 10.4 (L) 12.0 - 15.0 g/dL   HCT 40.930.6 (L) 81.136.0 - 91.446.0 %   MCV 84.3 78.0 - 100.0 fL   MCH 28.7 26.0 - 34.0 pg   MCHC 34.0 30.0 - 36.0 g/dL   RDW 78.215.6 (H) 95.611.5 - 21.315.5 %   Platelets 149 (L) 150 - 400 K/uL   Neutrophils Relative % 90 (H) 43 - 77 %   Neutro Abs 11.2 (H) 1.7 - 7.7 K/uL   Lymphocytes Relative 5 (L) 12 - 46 %   Lymphs Abs 0.7 0.7 - 4.0 K/uL   Monocytes Relative 5 3 - 12 %   Monocytes Absolute 0.6 0.1 - 1.0 K/uL   Eosinophils Relative 0 0 - 5 %   Eosinophils Absolute 0.0 0.0 - 0.7 K/uL   Basophils Relative 0 0 - 1 %   Basophils Absolute 0.0 0.0 - 0.1 K/uL    Urine culture E. Coli, pan sensitive Blood cultrures neg x 2 Renal US negative  A/P 1) Patient continues to spike fevers despite appropriate antibiotic coverage 2) WBC improved from 22K on admission to 12K 3) Consider ID consult tomorrow if still spiking temps 4) FWB reassuring

## 2014-12-15 ENCOUNTER — Inpatient Hospital Stay (HOSPITAL_COMMUNITY): Payer: No Typology Code available for payment source

## 2014-12-15 DIAGNOSIS — R509 Fever, unspecified: Secondary | ICD-10-CM

## 2014-12-15 DIAGNOSIS — O2343 Unspecified infection of urinary tract in pregnancy, third trimester: Secondary | ICD-10-CM

## 2014-12-15 DIAGNOSIS — Z3A31 31 weeks gestation of pregnancy: Secondary | ICD-10-CM

## 2014-12-15 DIAGNOSIS — O26893 Other specified pregnancy related conditions, third trimester: Secondary | ICD-10-CM

## 2014-12-15 DIAGNOSIS — B9620 Unspecified Escherichia coli [E. coli] as the cause of diseases classified elsewhere: Secondary | ICD-10-CM

## 2014-12-15 DIAGNOSIS — K5900 Constipation, unspecified: Secondary | ICD-10-CM

## 2014-12-15 LAB — CBC
HCT: 31.6 % — ABNORMAL LOW (ref 36.0–46.0)
HEMOGLOBIN: 11 g/dL — AB (ref 12.0–15.0)
MCH: 28.7 pg (ref 26.0–34.0)
MCHC: 34.8 g/dL (ref 30.0–36.0)
MCV: 82.5 fL (ref 78.0–100.0)
Platelets: 170 10*3/uL (ref 150–400)
RBC: 3.83 MIL/uL — AB (ref 3.87–5.11)
RDW: 15.3 % (ref 11.5–15.5)
WBC: 9.3 10*3/uL (ref 4.0–10.5)

## 2014-12-15 LAB — COMPREHENSIVE METABOLIC PANEL
ALK PHOS: 127 U/L — AB (ref 38–126)
ALT: 11 U/L — ABNORMAL LOW (ref 14–54)
AST: 15 U/L (ref 15–41)
Albumin: 2.2 g/dL — ABNORMAL LOW (ref 3.5–5.0)
Anion gap: 5 (ref 5–15)
BUN: 5 mg/dL — ABNORMAL LOW (ref 6–20)
CHLORIDE: 109 mmol/L (ref 101–111)
CO2: 21 mmol/L — ABNORMAL LOW (ref 22–32)
Calcium: 8.5 mg/dL — ABNORMAL LOW (ref 8.9–10.3)
Creatinine, Ser: 0.79 mg/dL (ref 0.44–1.00)
GFR calc Af Amer: >60 mL/min (ref >60)
GLUCOSE: 108 mg/dL — AB (ref 65–99)
POTASSIUM: 4.8 mmol/L (ref 3.5–5.1)
SODIUM: 135 mmol/L (ref 135–145)
Total Bilirubin: 0.1 mg/dL — ABNORMAL LOW (ref 0.3–1.2)
Total Protein: 5.6 g/dL — ABNORMAL LOW (ref 6.5–8.1)

## 2014-12-15 LAB — ABO/RH: ABO/RH(D): O POS

## 2014-12-15 LAB — LIPASE, BLOOD: Lipase: 13 U/L — ABNORMAL LOW (ref 22–51)

## 2014-12-15 LAB — TYPE AND SCREEN
ABO/RH(D): O POS
ANTIBODY SCREEN: NEGATIVE

## 2014-12-15 LAB — AMYLASE: Amylase: 34 U/L (ref 28–100)

## 2014-12-15 NOTE — Progress Notes (Signed)
RN notified Dr. Henderson CloudHorvath of CXR results.  Dr. Henderson CloudHorvath gave order to transfer patient to antenatal unit now that bed is available.

## 2014-12-15 NOTE — Consult Note (Signed)
[facility] for Infectious Disease  Date of Admission:  12/12/2014  Date of Consult:  12/15/2014  Reason for Consult: fever Referring Physician: Philis Pique  Impression/Recommendation Fever UTI Constipation- no BM since adm  Would Continue ceftriaxone Question if she is having drug fever (macrobid) Check amylase, lipase,  Bowel hygeine Check HIV  Comment- Would continue her current anbx, it may be that she had insufficient therapy prior to adm or that she had reaction to macrobid. Macrobid can cause hepatitis (which she does not have) and pancreatitis.    Thank you so much for this interesting consult,   Bobby Rumpf (pager) [phone] [email or web address]  Angelica Hester is an 24 y.o. female.  HPI: 24 yo F G1P0 75w4dwith hx of n/v, and low back pain. She was given macrobid (took 1 dose) at home however developed temp to 102.  She was adm on 6-28, WBC 22.7,  and started on ancef. Over next 24h she had fever and tachycardia. [facility] She was switched to ceftriaxone on 6-29 when UCx was growing GNR. This was reported as E coli (pan-sens). Due to continued fever, she underwent renal u/s which was normal (no abscess). She continued to have fever (102) on 6-30. Her tmax over last 24h is 100.7. CXR (-)    Past Medical History  Diagnosis Date  . Anemia   . Chlamydia     Past Surgical History  Procedure Laterality Date  . No past surgeries       No Known Allergies  Medications:  Scheduled: . cefTRIAXone (ROCEPHIN)  IV  1 g Intravenous Q12H  . docusate sodium  100 mg Oral Daily  . prenatal multivitamin  1 tablet Oral Q1200    Abtx:  Anti-infectives    Start     Dose/Rate Route Frequency Ordered Stop   12/13/14 2200  cefTRIAXone (ROCEPHIN) 1 g in dextrose 5 % 50 mL IVPB - Premix     1 g 100 mL/hr over 30 Minutes Intravenous Every 12 hours 12/13/14 2004     12/12/14 2200  ceFAZolin (ANCEF) IVPB 2 g/50 mL premix  Status:  Discontinued     2  g 100 mL/hr over 30 Minutes Intravenous 3 times per day 12/12/14 2018 12/13/14 2004      Total days of antibiotics: 4 ceftraixone          Social History:  reports that she has never smoked. She does not have any smokeless tobacco history on file. She reports that she does not drink alcohol or use illicit drugs.  Family History  Problem Relation Age of Onset  . Asthma Mother   . Asthma Brother     General ROS: eating well, no SOB, +R flank pain, no dysuria, no hematuria, NO BM, feels baby movements, see HPI.   Blood pressure 130/73, pulse 116, temperature 98.2 F (36.8 C), temperature source Oral, resp. rate 20, height 5' 5"  (1.651 m), weight 83.643 kg (184 lb 6.4 oz), last menstrual period 06/07/2014, SpO2 100 %. General appearance: alert, cooperative, no distress and appropriately gravid Eyes: negative findings: conjunctivae and sclerae normal and pupils equal, round, reactive to light and accomodation Throat: lips, mucosa, and tongue normal; teeth and gums normal Neck: no adenopathy and supple, symmetrical, trachea midline Lungs: clear to auscultation bilaterally Heart: regular rate and rhythm Abdomen: normal findings: bowel sounds normal, soft, non-tender and appropriately gravid and R flank pain, mild Extremities: edema trace. no homan's sign   Results for orders placed  or performed during the hospital encounter of 12/12/14 (from the past 48 hour(s))  MRSA PCR Screening     Status: None   Collection Time: 12/13/14  5:02 PM  Result Value Ref Range   MRSA by PCR NEGATIVE NEGATIVE    Comment:        The GeneXpert MRSA Assay (FDA approved for NASAL specimens only), is one component of a comprehensive MRSA colonization surveillance program. It is not intended to diagnose MRSA infection nor to guide or monitor treatment for MRSA infections.   CBC with Differential/Platelet     Status: Abnormal   Collection Time: 12/14/14  2:32 PM  Result Value Ref Range   WBC 12.5 (H)  4.0 - 10.5 K/uL   RBC 3.63 (L) 3.87 - 5.11 MIL/uL   Hemoglobin 10.4 (L) 12.0 - 15.0 g/dL   HCT 30.6 (L) 36.0 - 46.0 %   MCV 84.3 78.0 - 100.0 fL   MCH 28.7 26.0 - 34.0 pg   MCHC 34.0 30.0 - 36.0 g/dL   RDW 15.6 (H) 11.5 - 15.5 %   Platelets 149 (L) 150 - 400 K/uL   Neutrophils Relative % 90 (H) 43 - 77 %   Neutro Abs 11.2 (H) 1.7 - 7.7 K/uL   Lymphocytes Relative 5 (L) 12 - 46 %   Lymphs Abs 0.7 0.7 - 4.0 K/uL   Monocytes Relative 5 3 - 12 %   Monocytes Absolute 0.6 0.1 - 1.0 K/uL   Eosinophils Relative 0 0 - 5 %   Eosinophils Absolute 0.0 0.0 - 0.7 K/uL   Basophils Relative 0 0 - 1 %   Basophils Absolute 0.0 0.0 - 0.1 K/uL  CBC     Status: Abnormal   Collection Time: 12/15/14  8:35 AM  Result Value Ref Range   WBC 9.3 4.0 - 10.5 K/uL   RBC 3.83 (L) 3.87 - 5.11 MIL/uL   Hemoglobin 11.0 (L) 12.0 - 15.0 g/dL   HCT 31.6 (L) 36.0 - 46.0 %   MCV 82.5 78.0 - 100.0 fL   MCH 28.7 26.0 - 34.0 pg   MCHC 34.8 30.0 - 36.0 g/dL   RDW 15.3 11.5 - 15.5 %   Platelets 170 150 - 400 K/uL  Comprehensive metabolic panel     Status: Abnormal   Collection Time: 12/15/14  8:35 AM  Result Value Ref Range   Sodium 135 135 - 145 mmol/L   Potassium 4.8 3.5 - 5.1 mmol/L   Chloride 109 101 - 111 mmol/L   CO2 21 (L) 22 - 32 mmol/L   Glucose, Bld 108 (H) 65 - 99 mg/dL   BUN 5 (L) 6 - 20 mg/dL   Creatinine, Ser 0.79 0.44 - 1.00 mg/dL   Calcium 8.5 (L) 8.9 - 10.3 mg/dL   Total Protein 5.6 (L) 6.5 - 8.1 g/dL   Albumin 2.2 (L) 3.5 - 5.0 g/dL   AST 15 15 - 41 U/L   ALT 11 (L) 14 - 54 U/L   Alkaline Phosphatase 127 (H) 38 - 126 U/L   Total Bilirubin 0.1 (L) 0.3 - 1.2 mg/dL   GFR calc non Af Amer >60 >60 mL/min   GFR calc Af Amer >60 >60 mL/min    Comment: (NOTE) The eGFR has been calculated using the CKD EPI equation. This calculation has not been validated in all clinical situations. eGFR's persistently <60 mL/min signify possible Chronic Kidney Disease.    Anion gap 5 5 - 15    Comment:  Performed at [facility]      Component Value Date/Time   SDES BLOOD  RT HAND 12/13/2014 0321   SPECREQUEST  10 ML BOTH BOTTLES 12/13/2014 0321   CULT  12/13/2014 0321    NO GROWTH 1 DAY Performed at [facility] PENDING 12/13/2014 0321   Dg Chest 2 View  12/15/2014   CLINICAL DATA:  Fever, right flank pain, kidney infection  EXAM: CHEST  2 VIEW  COMPARISON:  None.  FINDINGS: Lungs are clear.  No pleural effusion or pneumothorax.  The heart is normal in size.  Visualized osseous structures are within normal limits.  IMPRESSION: No evidence of acute cardiopulmonary disease.   Electronically Signed   By: Julian Hy M.D.   On: 12/15/2014 10:09   US Renal  12/14/2014   CLINICAL DATA:  Pyelonephritis, [redacted] weeks pregnant  EXAM: RENAL / URINARY TRACT ULTRASOUND COMPLETE  COMPARISON:  None.  FINDINGS: Right Kidney:  Length: 12.2 cm. Mild right hydro nephrosis. No cortical thinning or focal mass. No stone identified. Maximal renal pelvis diameter 1.5 cm.  Left Kidney:  Length: 11.8 cm. Echogenicity within normal limits. No mass or hydronephrosis visualized.  Bladder:  Appears normal for degree of bladder distention. Bilateral ureteral jets visualized.  IMPRESSION: Mild right hydronephrosis.   Electronically Signed   By: Conchita Paris M.D.   On: 12/14/2014 11:51   Recent Results (from the past 240 hour(s))  Culture, OB Urine     Status: None   Collection Time: 12/11/14  8:10 PM  Result Value Ref Range Status   Specimen Description OB CLEAN CATCH  Final   Special Requests Normal  Final   Culture   Final    >=100,000 COLONIES/mL ESCHERICHIA COLI NO GROUP B STREP (S.AGALACTIAE) ISOLATED Performed at [facility]    Report Status 12/14/2014 FINAL  Final   Organism ID, Bacteria ESCHERICHIA COLI  Final      Susceptibility   Escherichia coli - MIC*    AMPICILLIN <=2 SENSITIVE Sensitive     CEFAZOLIN <=4 SENSITIVE Sensitive     CEFEPIME <=1 SENSITIVE  Sensitive     CEFTAZIDIME <=1 SENSITIVE Sensitive     CEFTRIAXONE <=1 SENSITIVE Sensitive     CIPROFLOXACIN <=0.25 SENSITIVE Sensitive     GENTAMICIN <=1 SENSITIVE Sensitive     IMIPENEM <=0.25 SENSITIVE Sensitive     TRIMETH/SULFA <=20 SENSITIVE Sensitive     AMPICILLIN/SULBACTAM <=2 SENSITIVE Sensitive     PIP/TAZO <=4 SENSITIVE Sensitive     * >=100,000 COLONIES/mL ESCHERICHIA COLI  Culture, blood (routine x 2)     Status: None (Preliminary result)   Collection Time: 12/13/14  3:15 AM  Result Value Ref Range Status   Specimen Description BLOOD  RT ARM  Final   Special Requests  10 ML BOTH BOTTLES  Final   Culture   Final    NO GROWTH 1 DAY Performed at [facility]    Report Status PENDING  Incomplete  Culture, blood (routine x 2)     Status: None (Preliminary result)   Collection Time: 12/13/14  3:21 AM  Result Value Ref Range Status   Specimen Description BLOOD  RT HAND  Final   Special Requests  10 ML BOTH BOTTLES  Final   Culture   Final    NO GROWTH 1 DAY Performed at [facility]    Report Status PENDING  Incomplete  MRSA PCR Screening     Status:  None   Collection Time: 12/13/14  5:02 PM  Result Value Ref Range Status   MRSA by PCR NEGATIVE NEGATIVE Final    Comment:        The GeneXpert MRSA Assay (FDA approved for NASAL specimens only), is one component of a comprehensive MRSA colonization surveillance program. It is not intended to diagnose MRSA infection nor to guide or monitor treatment for MRSA infections.       12/15/2014, 2:12 PM     LOS: 3 days

## 2014-12-15 NOTE — Progress Notes (Addendum)
24 y.o. G1P0 4355w4d HD#3 admitted for 31 WEEKS,FEVER AN CHILLS, presumed pyelo  Pt currently stable but not feeling as well as yesterday.  Having significant back pain.  Good FM.  NO LOF or bleeding.  Filed Vitals:   12/15/14 0027 12/15/14 0306 12/15/14 0410 12/15/14 0542  BP: 136/86 129/66    Pulse: 113 109    Temp: 98.7 F (37.1 C) 101.1 F (38.4 C) 99 F (37.2 C) 97.8 F (36.6 C)  TempSrc:  Oral Axillary Oral  Resp: 22 20    Height:      Weight:      SpO2: 100% 100%      Lungs CTA Cor RRR Abd  Soft, gravid, nontender Ex SCDs FHTs  Last night 140s, good short term variability, NST R Toco  occ  Results for orders placed or performed during the hospital encounter of 12/12/14 (from the past 24 hour(s))  CBC with Differential/Platelet     Status: Abnormal   Collection Time: 12/14/14  2:32 PM  Result Value Ref Range   WBC 12.5 (H) 4.0 - 10.5 K/uL   RBC 3.63 (L) 3.87 - 5.11 MIL/uL   Hemoglobin 10.4 (L) 12.0 - 15.0 g/dL   HCT 40.930.6 (L) 81.136.0 - 91.446.0 %   MCV 84.3 78.0 - 100.0 fL   MCH 28.7 26.0 - 34.0 pg   MCHC 34.0 30.0 - 36.0 g/dL   RDW 78.215.6 (H) 95.611.5 - 21.315.5 %   Platelets 149 (L) 150 - 400 K/uL   Neutrophils Relative % 90 (H) 43 - 77 %   Neutro Abs 11.2 (H) 1.7 - 7.7 K/uL   Lymphocytes Relative 5 (L) 12 - 46 %   Lymphs Abs 0.7 0.7 - 4.0 K/uL   Monocytes Relative 5 3 - 12 %   Monocytes Absolute 0.6 0.1 - 1.0 K/uL   Eosinophils Relative 0 0 - 5 %   Eosinophils Absolute 0.0 0.0 - 0.7 K/uL   Basophils Relative 0 0 - 1 %   Basophils Absolute 0.0 0.0 - 0.1 K/uL    A:  HD#3  5055w4d with pyelo and repeated spikes of temps despite antibx- e coli grew out of urine, sensitive to ceftriaxone.  Last temp 101 at 0300 on 7-1.  Currently afeb.  Blood cultures prelim is no growth.  Renal US with mild hydronephosis but bilateral jets seen and no other abnormalities.   P: Will repeat CBC, CMET, repeat HIV (negative at beginning of pregnancy) and A1C.  CXR- may have slightly decreased breath  sounds on R. ID consult.  Loreen Bankson A

## 2014-12-15 NOTE — Progress Notes (Signed)
Also will repeat Urine culture.

## 2014-12-15 NOTE — Progress Notes (Signed)
Labs  Results for orders placed or performed during the hospital encounter of 12/12/14 (from the past 24 hour(s))  CBC     Status: Abnormal   Collection Time: 12/15/14  8:35 AM  Result Value Ref Range   WBC 9.3 4.0 - 10.5 K/uL   RBC 3.83 (L) 3.87 - 5.11 MIL/uL   Hemoglobin 11.0 (L) 12.0 - 15.0 g/dL   HCT 16.131.6 (L) 09.636.0 - 04.546.0 %   MCV 82.5 78.0 - 100.0 fL   MCH 28.7 26.0 - 34.0 pg   MCHC 34.8 30.0 - 36.0 g/dL   RDW 40.915.3 81.111.5 - 91.415.5 %   Platelets 170 150 - 400 K/uL  Comprehensive metabolic panel     Status: Abnormal   Collection Time: 12/15/14  8:35 AM  Result Value Ref Range   Sodium 135 135 - 145 mmol/L   Potassium 4.8 3.5 - 5.1 mmol/L   Chloride 109 101 - 111 mmol/L   CO2 21 (L) 22 - 32 mmol/L   Glucose, Bld 108 (H) 65 - 99 mg/dL   BUN 5 (L) 6 - 20 mg/dL   Creatinine, Ser 7.820.79 0.44 - 1.00 mg/dL   Calcium 8.5 (L) 8.9 - 10.3 mg/dL   Total Protein 5.6 (L) 6.5 - 8.1 g/dL   Albumin 2.2 (L) 3.5 - 5.0 g/dL   AST 15 15 - 41 U/L   ALT 11 (L) 14 - 54 U/L   Alkaline Phosphatase 127 (H) 38 - 126 U/L   Total Bilirubin 0.1 (L) 0.3 - 1.2 mg/dL   GFR calc non Af Amer >60 >60 mL/min   GFR calc Af Amer >60 >60 mL/min   Anion gap 5 5 - 15  Amylase     Status: None   Collection Time: 12/15/14  8:35 AM  Result Value Ref Range   Amylase 34 28 - 100 U/L  Lipase, blood     Status: Abnormal   Collection Time: 12/15/14  8:35 AM  Result Value Ref Range   Lipase 13 (L) 22 - 51 U/L  Type and screen     Status: None   Collection Time: 12/15/14  4:24 PM  Result Value Ref Range   ABO/RH(D) O POS    Antibody Screen NEG    Sample Expiration 12/18/2014     CXR negative  HIV and A1C pending.  WBC is now back to normal.  Really appreciate Dr. Moshe CiproHatcher's consult and help.  Will continue current care.

## 2014-12-16 LAB — GLUCOSE, CAPILLARY: GLUCOSE-CAPILLARY: 126 mg/dL — AB (ref 65–99)

## 2014-12-16 LAB — URINE CULTURE
Culture: NO GROWTH
Special Requests: NORMAL

## 2014-12-16 LAB — HEMOGLOBIN A1C
Hgb A1c MFr Bld: 5.8 % — ABNORMAL HIGH (ref 4.8–5.6)
Mean Plasma Glucose: 120 mg/dL

## 2014-12-16 LAB — HIV ANTIBODY (ROUTINE TESTING W REFLEX): HIV SCREEN 4TH GENERATION: NONREACTIVE

## 2014-12-16 MED ORDER — POLYETHYLENE GLYCOL 3350 17 G PO PACK
17.0000 g | PACK | Freq: Every day | ORAL | Status: DC
  Administered 2014-12-16 – 2014-12-18 (×3): 17 g via ORAL
  Filled 2014-12-16 (×3): qty 1

## 2014-12-16 MED ORDER — BETAMETHASONE SOD PHOS & ACET 6 (3-3) MG/ML IJ SUSP
12.0000 mg | INTRAMUSCULAR | Status: AC
  Administered 2014-12-16 – 2014-12-17 (×2): 12 mg via INTRAMUSCULAR
  Filled 2014-12-16 (×2): qty 2

## 2014-12-16 MED ORDER — ERTAPENEM SODIUM 1 G IJ SOLR
1.0000 g | INTRAMUSCULAR | Status: AC
  Administered 2014-12-16 – 2014-12-17 (×2): 1 g via INTRAVENOUS
  Filled 2014-12-16 (×2): qty 1

## 2014-12-16 NOTE — Progress Notes (Signed)
Appreciate Dr. Moshe CiproHatcher's help.  Will do course of BMZ.    New UCX is no growth x1 day.    Will get US for EFW and AFI.

## 2014-12-16 NOTE — Progress Notes (Signed)
Plan of care discussed with patient by physician. Discussing steroid injections to help mature fetal lungs. Patient voiced an understanding. Physician to consult with Infectious Disease physician about this. Patient made aware of accuchecks to be performed to monitor blood sugar for 24 hours.

## 2014-12-16 NOTE — Progress Notes (Signed)
INFECTIOUS DISEASE PROGRESS NOTE  ID: Delford FieldCapriee Okelley is a 24 y.o. female with  Active Problems:   Pyelonephritis affecting pregnancy in third trimester, antepartum  Subjective: 2 BM this AM- loose and solid.   Abtx:  Anti-infectives    Start     Dose/Rate Route Frequency Ordered Stop   12/13/14 2200  cefTRIAXone (ROCEPHIN) 1 g in dextrose 5 % 50 mL IVPB - Premix     1 g 100 mL/hr over 30 Minutes Intravenous Every 12 hours 12/13/14 2004     12/12/14 2200  ceFAZolin (ANCEF) IVPB 2 g/50 mL premix  Status:  Discontinued     2 g 100 mL/hr over 30 Minutes Intravenous 3 times per day 12/12/14 2018 12/13/14 2004      Medications:  Scheduled: . cefTRIAXone (ROCEPHIN)  IV  1 g Intravenous Q12H  . docusate sodium  100 mg Oral Daily  . prenatal multivitamin  1 tablet Oral Q1200    Objective: Vital signs in last 24 hours: Temp:  [97.8 F (36.6 C)-103.1 F (39.5 C)] 100.2 F (37.9 C) (07/02 1351) Pulse Rate:  [88-138] 99 (07/02 1218) Resp:  [18-20] 20 (07/02 0729) BP: (112-133)/(64-75) 125/75 mmHg (07/02 1218)   General appearance: alert, cooperative and no distress Resp: clear to auscultation bilaterally Cardio: tachycardic GI: normal findings: bowel sounds normal and soft, non-tender and appropriately distended, +fetal movements.  Extremities: Homans sign is negative, no sign of DVT  Lab Results  Recent Labs  12/14/14 1432 12/15/14 0835  WBC 12.5* 9.3  HGB 10.4* 11.0*  HCT 30.6* 31.6*  NA  --  135  K  --  4.8  CL  --  109  CO2  --  21*  BUN  --  5*  CREATININE  --  0.79   Liver Panel  Recent Labs  12/15/14 0835  PROT 5.6*  ALBUMIN 2.2*  AST 15  ALT 11*  ALKPHOS 127*  BILITOT 0.1*   Sedimentation Rate No results for input(s): ESRSEDRATE in the last 72 hours. C-Reactive Protein No results for input(s): CRP in the last 72 hours.  Microbiology: Recent Results (from the past 240 hour(s))  Culture, OB Urine     Status: None   Collection Time:  12/11/14  8:10 PM  Result Value Ref Range Status   Specimen Description OB CLEAN CATCH  Final   Special Requests Normal  Final   Culture   Final    >=100,000 COLONIES/mL ESCHERICHIA COLI NO GROUP B STREP (S.AGALACTIAE) ISOLATED [facility]    Report Status 12/14/2014 FINAL  Final   Organism ID, Bacteria ESCHERICHIA COLI  Final      Susceptibility   Escherichia coli - MIC*    AMPICILLIN <=2 SENSITIVE Sensitive     CEFAZOLIN <=4 SENSITIVE Sensitive     CEFEPIME <=1 SENSITIVE Sensitive     CEFTAZIDIME <=1 SENSITIVE Sensitive     CEFTRIAXONE <=1 SENSITIVE Sensitive     CIPROFLOXACIN <=0.25 SENSITIVE Sensitive     GENTAMICIN <=1 SENSITIVE Sensitive     IMIPENEM <=0.25 SENSITIVE Sensitive     TRIMETH/SULFA <=20 SENSITIVE Sensitive     AMPICILLIN/SULBACTAM <=2 SENSITIVE Sensitive     PIP/TAZO <=4 SENSITIVE Sensitive     * >=100,000 COLONIES/mL ESCHERICHIA COLI  Culture, blood (routine x 2)     Status: None (Preliminary result)   Collection Time: 12/13/14  3:15 AM  Result Value Ref Range Status   Specimen Description BLOOD  RT ARM  Final  Special Requests  10 ML BOTH BOTTLES  Final   Culture   Final    NO GROWTH 3 DAYS Performed at [facility]    Report Status PENDING  Incomplete  Culture, blood (routine x 2)     Status: None (Preliminary result)   Collection Time: 12/13/14  3:21 AM  Result Value Ref Range Status   Specimen Description BLOOD  RT HAND  Final   Special Requests  10 ML BOTH BOTTLES  Final   Culture   Final    NO GROWTH 3 DAYS Performed at [facility]    Report Status PENDING  Incomplete  MRSA PCR Screening     Status: None   Collection Time: 12/13/14  5:02 PM  Result Value Ref Range Status   MRSA by PCR NEGATIVE NEGATIVE Final    Comment:        The GeneXpert MRSA Assay (FDA approved for NASAL specimens only), is one component of a comprehensive MRSA colonization surveillance program. It is not intended to diagnose  MRSA infection nor to guide or monitor treatment for MRSA infections.   Culture, Urine     Status: None   Collection Time: 12/15/14  9:08 AM  Result Value Ref Range Status   Specimen Description URINE, CLEAN CATCH  Final   Special Requests Normal  Final   Culture   Final    NO GROWTH 1 DAY Performed at [facility]    Report Status 12/16/2014 FINAL  Final    Studies/Results: Dg Chest 2 View  12/15/2014   CLINICAL DATA:  Fever, right flank pain, kidney infection  EXAM: CHEST  2 VIEW  COMPARISON:  None.  FINDINGS: Lungs are clear.  No pleural effusion or pneumothorax.  The heart is normal in size.  Visualized osseous structures are within normal limits.  IMPRESSION: No evidence of acute cardiopulmonary disease.   Electronically Signed   By: Charline Bills M.D.   On: 12/15/2014 10:09     Assessment/Plan: Fever IUP UTI Constipation- no BM since adm- 2 BM today.   Total days of antibiotics: 5 ceftriaxone  No problem with steroids. Will change her ceftriaxone to invanz. Mostly due to concerns regarding drug fever.  hopefully she is nearing the end of her course of anbx.  She appears well despite her high fevers.  If continued high fever, consider imaging of her chest/abd/pelvis         Johny Sax Infectious Diseases (pager) [phone] [email or web address] 12/16/2014, 3:04 PM  LOS: 4 days

## 2014-12-16 NOTE — Progress Notes (Signed)
24 y.o. G1P0 7064w5d HD#4 admitted for 31 WEEKS,FEVER AN CHILLS, pyelo, unremitting fever.  Pt currently stable but continues with back and flank pain and spiked 3 times in last 24 hour- recently as high as 103.  Good FM.  Filed Vitals:   12/15/14 1937 12/16/14 0017 12/16/14 0401 12/16/14 0617  BP: 122/69 133/75 130/75   Pulse: 138 123 109   Temp: 99 F (37.2 C) 102.3 F (39.1 C) 98.5 F (36.9 C) 103.1 F (39.5 C)  TempSrc: Oral Oral Oral   Resp: 20 20 18    Height:      Weight:      SpO2:        Lungs CTA Cor RRR Abd  Soft, gravid, nontender Ex SCDs FHTs  Last night 130s, good short term variability, NST R Toco  Last night q 10  HIV NR A1C 5.8- borderline Amylase/Lipase - both low normal WBC yesterday 9.3 with left shift still CXR completely normal  A:  HD#4  4864w5d with pyelo and unresolving fevers.    P: 1.  Continue Ceftriaxone per ID- appreciate their help. 2.  Miralax and stool softeners for bowel hygiene. 3.  Some contractions last night but still category one tracing; will check with ID that betamethasone would not be contra indicated and will give if so for fetal lung maturity in case of needing early delivery. 4.  A1C was borderline at 5.8- will check a few sugars fasting and 2 hour pp to make sure they are within range.    Tyliah Schlereth A

## 2014-12-17 ENCOUNTER — Inpatient Hospital Stay (HOSPITAL_COMMUNITY): Payer: No Typology Code available for payment source

## 2014-12-17 LAB — GLUCOSE, CAPILLARY
GLUCOSE-CAPILLARY: 111 mg/dL — AB (ref 65–99)
GLUCOSE-CAPILLARY: 119 mg/dL — AB (ref 65–99)
GLUCOSE-CAPILLARY: 144 mg/dL — AB (ref 65–99)
GLUCOSE-CAPILLARY: 96 mg/dL (ref 65–99)

## 2014-12-17 NOTE — Progress Notes (Signed)
24 y.o. G1P0 4535w6d HD#5 admitted for 31 WEEKS,FEVER AN CHILLS, pyelo, recurring fevers.  Pt currently stable with no c/o today  Good FM.  Filed Vitals:   12/16/14 2059 12/17/14 0010 12/17/14 0608 12/17/14 0717  BP: 132/78 127/77 127/74 123/69  Pulse: 108 101 87 84  Temp: 97.8 F (36.6 C) 97.8 F (36.6 C) 98.2 F (36.8 C) 97.3 F (36.3 C)  TempSrc: Oral Oral Oral Oral  Resp: 20 20 18 18   Height:      Weight:      SpO2:        Lungs CTA Cor RRR Abd  Soft, gravid, nontender Ex SCDs FHTs  Last night 120s, good short term variability, NST R- tow mild variables.  Previous two NSTs R with cat 1 tracing.  Todays P Toco  occ  Results for orders placed or performed during the hospital encounter of 12/12/14 (from the past 24 hour(s))  Glucose, capillary     Status: Abnormal   Collection Time: 12/16/14  6:36 PM  Result Value Ref Range   Glucose-Capillary 126 (H) 65 - 99 mg/dL  Glucose, capillary     Status: Abnormal   Collection Time: 12/17/14  1:10 AM  Result Value Ref Range   Glucose-Capillary 111 (H) 65 - 99 mg/dL  Glucose, capillary     Status: Abnormal   Collection Time: 12/17/14  6:10 AM  Result Value Ref Range   Glucose-Capillary 119 (H) 65 - 99 mg/dL    A:  HD#5  3735w6d with pyelo, recurring fevers.  Appreciate Dr. Moshe CiproHatcher's help- changed antibiotic yesterday in case of drug fever and  Pt has now been afebrile since 1300 yesterday.    P: 1.  Appreciate Dr. Tyrone AppleHatchers help- changed antibiotic yesterday in case of drug fever (now on Ivanz) and  Pt has now been afebrile since 1300 yesterday.  Blood cultures have been negative x3 days and new urine culture negative x1 day.  CBGs are all normal except for fasting this am- 119 but just received BMZ.   Will get a few more.    2.  BMZ given yesterday in case of early delivery needed.  Second dose due tonight.  3.  US shows EFW 4#5, 60%ile, AFI 14.  Presumed vtx by images but final report is pending.  Angelica Hester A

## 2014-12-17 NOTE — Progress Notes (Signed)
INFECTIOUS DISEASE PROGRESS NOTE  ID: Angelica Hester is a 24 y.o. female with  Active Problems:   Pyelonephritis affecting pregnancy in third trimester, antepartum  Subjective: Without complaints.  BM this AM.  baby moving well.   Abtx:  Anti-infectives    Start     Dose/Rate Route Frequency Ordered Stop   12/16/14 1800  ertapenem (INVANZ) 1 g in sodium chloride 0.9 % 50 mL IVPB     1 g 100 mL/hr over 30 Minutes Intravenous Every 24 hours 12/16/14 1524     12/13/14 2200  cefTRIAXone (ROCEPHIN) 1 g in dextrose 5 % 50 mL IVPB - Premix  Status:  Discontinued     1 g 100 mL/hr over 30 Minutes Intravenous Every 12 hours 12/13/14 2004 12/16/14 1524   12/12/14 2200  ceFAZolin (ANCEF) IVPB 2 g/50 mL premix  Status:  Discontinued     2 g 100 mL/hr over 30 Minutes Intravenous 3 times per day 12/12/14 2018 12/13/14 2004      Medications:  Scheduled: . betamethasone acetate-betamethasone sodium phosphate  12 mg Intramuscular 1 day or 1 dose  . docusate sodium  100 mg Oral Daily  . ertapenem  1 g Intravenous Q24H  . polyethylene glycol  17 g Oral Daily  . prenatal multivitamin  1 tablet Oral Q1200    Objective: Vital signs in last 24 hours: Temp:  [97.3 F (36.3 C)-98.2 F (36.8 C)] 97.4 F (36.3 C) (07/03 1212) Pulse Rate:  [84-113] 84 (07/03 0717) Resp:  [16-20] 16 (07/03 1212) BP: (120-132)/(69-78) 123/69 mmHg (07/03 0717)   General appearance: alert, cooperative and no distress Resp: clear to auscultation bilaterally Cardio: regular rate and rhythm GI: normal findings: bowel sounds normal, soft, non-tender and appropriately gravid  Lab Results  Recent Labs  12/15/14 0835  WBC 9.3  HGB 11.0*  HCT 31.6*  NA 135  K 4.8  CL 109  CO2 21*  BUN 5*  CREATININE 0.79   Liver Panel  Recent Labs  12/15/14 0835  PROT 5.6*  ALBUMIN 2.2*  AST 15  ALT 11*  ALKPHOS 127*  BILITOT 0.1*   Sedimentation Rate No results for input(s): ESRSEDRATE in the last 72  hours. C-Reactive Protein No results for input(s): CRP in the last 72 hours.  Microbiology: Recent Results (from the past 240 hour(s))  Culture, OB Urine     Status: None   Collection Time: 12/11/14  8:10 PM  Result Value Ref Range Status   Specimen Description OB CLEAN CATCH  Final   Special Requests Normal  Final   Culture   Final    >=100,000 COLONIES/mL ESCHERICHIA COLI NO GROUP B STREP (S.AGALACTIAE) ISOLATED Performed at [facility]     Report Status 12/14/2014 FINAL  Final   Organism ID, Bacteria ESCHERICHIA COLI  Final      Susceptibility   Escherichia coli - MIC*    AMPICILLIN <=2 SENSITIVE Sensitive     CEFAZOLIN <=4 SENSITIVE Sensitive     CEFEPIME <=1 SENSITIVE Sensitive     CEFTAZIDIME <=1 SENSITIVE Sensitive     CEFTRIAXONE <=1 SENSITIVE Sensitive     CIPROFLOXACIN <=0.25 SENSITIVE Sensitive     GENTAMICIN <=1 SENSITIVE Sensitive     IMIPENEM <=0.25 SENSITIVE Sensitive     TRIMETH/SULFA <=20 SENSITIVE Sensitive     AMPICILLIN/SULBACTAM <=2 SENSITIVE Sensitive     PIP/TAZO <=4 SENSITIVE Sensitive     * >=100,000 COLONIES/mL ESCHERICHIA COLI  Culture, blood (routine x 2)  Status: None (Preliminary result)   Collection Time: 12/13/14  3:15 AM  Result Value Ref Range Status   Specimen Description BLOOD  RT ARM  Final   Special Requests  10 ML BOTH BOTTLES  Final   Culture   Final    NO GROWTH 4 DAYS Performed at [facility]    Report Status PENDING  Incomplete  Culture, blood (routine x 2)     Status: None (Preliminary result)   Collection Time: 12/13/14  3:21 AM  Result Value Ref Range Status   Specimen Description BLOOD  RT HAND  Final   Special Requests  10 ML BOTH BOTTLES  Final   Culture   Final    NO GROWTH 4 DAYS Performed at [facility]    Report Status PENDING  Incomplete  MRSA PCR Screening     Status: None   Collection Time: 12/13/14  5:02 PM  Result Value Ref Range Status   MRSA by PCR NEGATIVE NEGATIVE Final     Comment:        The GeneXpert MRSA Assay (FDA approved for NASAL specimens only), is one component of a comprehensive MRSA colonization surveillance program. It is not intended to diagnose MRSA infection nor to guide or monitor treatment for MRSA infections.   Culture, Urine     Status: None   Collection Time: 12/15/14  9:08 AM  Result Value Ref Range Status   Specimen Description URINE, CLEAN CATCH  Final   Special Requests Normal  Final   Culture   Final    NO GROWTH 1 DAY Performed at [facility]    Report Status 12/16/2014 FINAL  Final    Studies/Results: No results found.   Assessment/Plan: Fever IUP UTI Constipation- 2 BM 7-2.   Total days of antibiotics: 6 Invanz  Her fever has broken. I am not sure if this is treatment of her UTI, change in her anbx, steroids or any combination of these.  Will stop invanz tomorrow (day 7 of anbx) Will watch her temp curve         Johny Sax Infectious Diseases (pager) [phone] [email or web address] 12/17/2014, 4:08 PM  LOS: 5 days

## 2014-12-18 LAB — GLUCOSE, CAPILLARY
GLUCOSE-CAPILLARY: 123 mg/dL — AB (ref 65–99)
Glucose-Capillary: 104 mg/dL — ABNORMAL HIGH (ref 65–99)
Glucose-Capillary: 121 mg/dL — ABNORMAL HIGH (ref 65–99)
Glucose-Capillary: 137 mg/dL — ABNORMAL HIGH (ref 65–99)

## 2014-12-18 LAB — CULTURE, BLOOD (ROUTINE X 2)
Culture: NO GROWTH
Culture: NO GROWTH
Special Requests: 10
Special Requests: 10

## 2014-12-18 MED ORDER — CEPHALEXIN 500 MG PO CAPS
500.0000 mg | ORAL_CAPSULE | Freq: Four times a day (QID) | ORAL | Status: DC
  Administered 2014-12-18 – 2014-12-19 (×5): 500 mg via ORAL
  Filled 2014-12-18 (×9): qty 1

## 2014-12-18 NOTE — Progress Notes (Signed)
Patient ID: Angelica Hester, female   DOB: 1991-03-16, 24 y.o.   MRN: [id number]  HD#6  32 weeks w/ pyelo  S: Pt feeling much better. Flank pain resolved. Active fetal movement, no LOF, no VB O:  Filed Vitals:   12/17/14 1936 12/18/14 0015 12/18/14 0627 12/18/14 0810  BP: 117/68 107/52    Pulse: 83 78    Temp: 97.8 F (36.6 C)  97.4 F (36.3 C)   TempSrc: Oral  Oral   Resp: 18   20  Height:      Weight:      SpO2:       AOX3, NAD Abd soft, ND, NT No flank pain NST reactive, cat 1 tracing  A/P 1) Now 36 hours out from last temp spike. Today last day of IV abx. Will change to po abx for 1 additional week. If remains AF off IV abx then plan D/C home tomorrow

## 2014-12-18 NOTE — Progress Notes (Signed)
INFECTIOUS DISEASE PROGRESS NOTE  ID: Angelica Hester is a 24 y.o. female with  Active Problems:   Pyelonephritis affecting pregnancy in third trimester, antepartum  Subjective: Without complaints.  No further back pain.  Baby moving well.   Abtx:  Anti-infectives    Start     Dose/Rate Route Frequency Ordered Stop   12/18/14 1200  cephALEXin (KEFLEX) capsule 500 mg     500 mg Oral 4 times per day 12/18/14 1057 12/25/14 1159   12/16/14 1800  ertapenem (INVANZ) 1 g in sodium chloride 0.9 % 50 mL IVPB     1 g 100 mL/hr over 30 Minutes Intravenous Every 24 hours 12/16/14 1524 12/17/14 1819   12/13/14 2200  cefTRIAXone (ROCEPHIN) 1 g in dextrose 5 % 50 mL IVPB - Premix  Status:  Discontinued     1 g 100 mL/hr over 30 Minutes Intravenous Every 12 hours 12/13/14 2004 12/16/14 1524   12/12/14 2200  ceFAZolin (ANCEF) IVPB 2 g/50 mL premix  Status:  Discontinued     2 g 100 mL/hr over 30 Minutes Intravenous 3 times per day 12/12/14 2018 12/13/14 2004      Medications:  Scheduled: . cephALEXin  500 mg Oral 4 times per day  . docusate sodium  100 mg Oral Daily  . polyethylene glycol  17 g Oral Daily  . prenatal multivitamin  1 tablet Oral Q1200    Objective: Vital signs in last 24 hours: Temp:  [97.4 F (36.3 C)-98 F (36.7 C)] 97.5 F (36.4 C) (07/04 1100) Pulse Rate:  [78-98] 98 (07/04 1100) Resp:  [18-20] 20 (07/04 0810) BP: (107-122)/(52-71) 116/62 mmHg (07/04 1100)   General appearance: alert, cooperative and no distress Resp: clear to auscultation bilaterally Cardio: regular rate and rhythm GI: normal findings: bowel sounds normal, soft, non-tender and appropriately distended.   Lab Results No results for input(s): WBC, HGB, HCT, NA, K, CL, CO2, BUN, CREATININE, GLU in the last 72 hours.  Invalid input(s): PLATELETS Liver Panel No results for input(s): PROT, ALBUMIN, AST, ALT, ALKPHOS, BILITOT, BILIDIR, IBILI in the last 72 hours. Sedimentation Rate No results  for input(s): ESRSEDRATE in the last 72 hours. C-Reactive Protein No results for input(s): CRP in the last 72 hours.  Microbiology: Recent Results (from the past 240 hour(s))  Culture, OB Urine     Status: None   Collection Time: 12/11/14  8:10 PM  Result Value Ref Range Status   Specimen Description OB CLEAN CATCH  Final   Special Requests Normal  Final   Culture   Final    >=100,000 COLONIES/mL ESCHERICHIA COLI NO GROUP B STREP (S.AGALACTIAE) ISOLATED Performed at [facility]    Report Status 12/14/2014 FINAL  Final   Organism ID, Bacteria ESCHERICHIA COLI  Final      Susceptibility   Escherichia coli - MIC*    AMPICILLIN <=2 SENSITIVE Sensitive     CEFAZOLIN <=4 SENSITIVE Sensitive     CEFEPIME <=1 SENSITIVE Sensitive     CEFTAZIDIME <=1 SENSITIVE Sensitive     CEFTRIAXONE <=1 SENSITIVE Sensitive     CIPROFLOXACIN <=0.25 SENSITIVE Sensitive     GENTAMICIN <=1 SENSITIVE Sensitive     IMIPENEM <=0.25 SENSITIVE Sensitive     TRIMETH/SULFA <=20 SENSITIVE Sensitive     AMPICILLIN/SULBACTAM <=2 SENSITIVE Sensitive     PIP/TAZO <=4 SENSITIVE Sensitive     * >=100,000 COLONIES/mL ESCHERICHIA COLI  Culture, blood (routine x 2)     Status: None   Collection  Time: 12/13/14  3:15 AM  Result Value Ref Range Status   Specimen Description BLOOD  RT ARM  Final   Special Requests  10 ML BOTH BOTTLES  Final   Culture   Final    NO GROWTH 5 DAYS Performed at [facility]    Report Status 12/18/2014 FINAL  Final  Culture, blood (routine x 2)     Status: None   Collection Time: 12/13/14  3:21 AM  Result Value Ref Range Status   Specimen Description BLOOD  RT HAND  Final   Special Requests  10 ML BOTH BOTTLES  Final   Culture   Final    NO GROWTH 5 DAYS Performed at [facility]    Report Status 12/18/2014 FINAL  Final  MRSA PCR Screening     Status: None   Collection Time: 12/13/14  5:02 PM  Result Value Ref Range Status   MRSA by PCR NEGATIVE NEGATIVE Final     Comment:        The GeneXpert MRSA Assay (FDA approved for NASAL specimens only), is one component of a comprehensive MRSA colonization surveillance program. It is not intended to diagnose MRSA infection nor to guide or monitor treatment for MRSA infections.   Culture, Urine     Status: None   Collection Time: 12/15/14  9:08 AM  Result Value Ref Range Status   Specimen Description URINE, CLEAN CATCH  Final   Special Requests Normal  Final   Culture   Final    NO GROWTH 1 DAY Performed at [facility]    Report Status 12/16/2014 FINAL  Final    Studies/Results: US Ob Comp + 14 Wk  12/18/2014   OBSTETRICAL ULTRASOUND: This exam was performed within a [facility]. The OB US report was generated in the [facility] system, and faxed to the ordering physician.   This report is available in the [employer]. See the [facility] Obstetric US report via the Image Link.    Assessment/Plan: Fever IUP UTI Constipation- 2 BM 7-2.   Total days of antibiotics: 7 (Invanz)  Has been afebrile Not clear if this is from steroids or changing her anbx to new class.  Will sign off         Johny Sax Infectious Diseases (pager) [phone] [email or web address] 12/18/2014, 5:02 PM  LOS: 6 days

## 2014-12-19 LAB — GLUCOSE, CAPILLARY
GLUCOSE-CAPILLARY: 87 mg/dL (ref 65–99)
GLUCOSE-CAPILLARY: 107 mg/dL — AB (ref 65–99)
Glucose-Capillary: 108 mg/dL — ABNORMAL HIGH (ref 65–99)

## 2014-12-19 MED ORDER — CEPHALEXIN 500 MG PO CAPS: 500.0000 mg | ORAL_CAPSULE | Freq: Four times a day (QID) | ORAL | Status: DC

## 2014-12-19 NOTE — Discharge Summary (Signed)
  Pt discharged HD#9 She presented with R flank pain and fever, was treated for pyelo initially with Ancef and switched HD#2 to Rocephin.  She continued to spike fevers for several days and ID was consulted.  They recommended continued Rocephin and then switched to Invanz for a total of 7 days of IV abx.  She was then afebrile for 48 hrs and switched to oral Keflex.  She continued to remain afebrile.  WBCs normalized.  Fetus reassuring throughout stay.  She was dischared home with 7 day course of Keflex and will start suppressive therapy thereafter.

## 2015-01-19 ENCOUNTER — Other Ambulatory Visit: Payer: Self-pay | Admitting: Obstetrics & Gynecology

## 2015-02-20 ENCOUNTER — Inpatient Hospital Stay (HOSPITAL_COMMUNITY)
Admission: AD | Admit: 2015-02-20 | Discharge: 2015-02-24 | DRG: 775 | Disposition: A | Discharge status: Home / Self Care | Payer: No Typology Code available for payment source | Source: Ambulatory Visit | Attending: Obstetrics | Admitting: Obstetrics

## 2015-02-20 ENCOUNTER — Encounter (HOSPITAL_COMMUNITY): Payer: Self-pay | Admitting: *Deleted

## 2015-02-20 DIAGNOSIS — O480 Post-term pregnancy: Secondary | ICD-10-CM | POA: Diagnosis present

## 2015-02-20 DIAGNOSIS — O4103X0 Oligohydramnios, third trimester, not applicable or unspecified: Secondary | ICD-10-CM | POA: Diagnosis present

## 2015-02-20 DIAGNOSIS — O4100X0 Oligohydramnios, unspecified trimester, not applicable or unspecified: Secondary | ICD-10-CM | POA: Diagnosis present

## 2015-02-20 DIAGNOSIS — Z3A41 41 weeks gestation of pregnancy: Secondary | ICD-10-CM | POA: Diagnosis present

## 2015-02-20 HISTORY — DX: Tubulo-interstitial nephritis, not specified as acute or chronic: N12

## 2015-02-20 LAB — CBC
HEMATOCRIT: 34.9 % — AB (ref 36.0–46.0)
HEMOGLOBIN: 11.8 g/dL — AB (ref 12.0–15.0)
MCH: 27.5 pg (ref 26.0–34.0)
MCHC: 33.8 g/dL (ref 30.0–36.0)
MCV: 81.4 fL (ref 78.0–100.0)
Platelets: 206 10*3/uL (ref 150–400)
RBC: 4.29 MIL/uL (ref 3.87–5.11)
RDW: 16.3 % — ABNORMAL HIGH (ref 11.5–15.5)
WBC: 10.8 10*3/uL — AB (ref 4.0–10.5)

## 2015-02-20 LAB — TYPE AND SCREEN
ABO/RH(D): O POS
ANTIBODY SCREEN: NEGATIVE

## 2015-02-20 MED ORDER — CITRIC ACID-SODIUM CITRATE 334-500 MG/5ML PO SOLN: 30.0000 mL | ORAL | Status: DC | PRN

## 2015-02-20 MED ORDER — BUTORPHANOL TARTRATE 1 MG/ML IJ SOLN
1.0000 mg | INTRAMUSCULAR | Status: DC | PRN
  Administered 2015-02-21 (×2): 1 mg via INTRAVENOUS
  Filled 2015-02-20 (×2): qty 1

## 2015-02-20 MED ORDER — LIDOCAINE HCL (PF) 1 % IJ SOLN
30.0000 mL | INTRAMUSCULAR | Status: DC | PRN
  Filled 2015-02-20: qty 30

## 2015-02-20 MED ORDER — LACTATED RINGERS IV SOLN
500.0000 mL | INTRAVENOUS | Status: DC | PRN
  Administered 2015-02-21 – 2015-02-22 (×3): 500 mL via INTRAVENOUS

## 2015-02-20 MED ORDER — OXYCODONE-ACETAMINOPHEN 5-325 MG PO TABS
2.0000 | ORAL_TABLET | ORAL | Status: DC | PRN
Start: 2015-02-20 — End: 2015-02-22

## 2015-02-20 MED ORDER — OXYTOCIN BOLUS FROM INFUSION: 500.0000 mL | INTRAVENOUS | Status: DC

## 2015-02-20 MED ORDER — OXYTOCIN 40 UNITS IN LACTATED RINGERS INFUSION - SIMPLE MED
62.5000 mL/h | INTRAVENOUS | Status: DC
  Administered 2015-02-22: 999 mL/h via INTRAVENOUS

## 2015-02-20 MED ORDER — FLEET ENEMA 7-19 GM/118ML RE ENEM: 1.0000 | ENEMA | Freq: Once | RECTAL | Status: DC

## 2015-02-20 MED ORDER — MISOPROSTOL 25 MCG QUARTER TABLET
25.0000 ug | ORAL_TABLET | ORAL | Status: DC
  Administered 2015-02-20 – 2015-02-21 (×2): 25 ug via VAGINAL
  Filled 2015-02-20 (×4): qty 1
  Filled 2015-02-20 (×2): qty 0.25
  Filled 2015-02-20 (×2): qty 1

## 2015-02-20 MED ORDER — ZOLPIDEM TARTRATE 5 MG PO TABS
5.0000 mg | ORAL_TABLET | Freq: Every evening | ORAL | Status: DC | PRN
  Administered 2015-02-21: 5 mg via ORAL
  Filled 2015-02-20: qty 1

## 2015-02-20 MED ORDER — OXYCODONE-ACETAMINOPHEN 5-325 MG PO TABS
1.0000 | ORAL_TABLET | ORAL | Status: DC | PRN
Start: 2015-02-20 — End: 2015-02-22

## 2015-02-20 MED ORDER — ONDANSETRON HCL 4 MG/2ML IJ SOLN: 4.0000 mg | Freq: Four times a day (QID) | INTRAMUSCULAR | Status: DC | PRN

## 2015-02-20 MED ORDER — ACETAMINOPHEN 325 MG PO TABS: 650.0000 mg | ORAL_TABLET | ORAL | Status: DC | PRN

## 2015-02-20 MED ORDER — TERBUTALINE SULFATE 1 MG/ML IJ SOLN
0.2500 mg | Freq: Once | INTRAMUSCULAR | Status: DC | PRN
  Filled 2015-02-20: qty 1

## 2015-02-20 MED ORDER — LACTATED RINGERS IV SOLN
INTRAVENOUS | Status: DC
  Administered 2015-02-20 (×2): via INTRAVENOUS
  Administered 2015-02-22: 1000 mL via INTRAVENOUS

## 2015-02-20 NOTE — H&P (Signed)
Angelica Hester is a 24 y.o. female presenting for Induction of labor from office for non-reactive NST And oligohydramnios AFI 5 cm. She denies CTX, no vaginal bleeding, no leaking of fluid. She reports  Good FM.   Maternal Medical History:  Reason for admission: Nausea. Induction of labor for non-reactive NST and oligohydramnios. AFI 5cm   Contractions: Onset was 1-2 hours ago.   Frequency: rare.   Duration is approximately 30 seconds.   Perceived severity is mild.    Fetal activity: Perceived fetal activity is normal.   Last perceived fetal movement was within the past 12 hours.    Prenatal complications: Oligohydramnios.   Prenatal Complications - Diabetes: none.    OB History    Gravida Para Term Preterm AB TAB SAB Ectopic Multiple Living   1              Past Medical History  Diagnosis Date  . Anemia   . Chlamydia   . Pyelonephritis    Past Surgical History  Procedure Laterality Date  . No past surgeries     Family History: family history includes Asthma in her brother and mother. Social History:  reports that she has never smoked. She does not have any smokeless tobacco history on file. She reports that she does not drink alcohol or use illicit drugs.   Prenatal Transfer Tool  Maternal Diabetes: No Genetic Screening: Normal - quad screen Maternal Ultrasounds/Referrals: Normal Fetal Ultrasounds or other Referrals:  Other: Anatomy scan normal Maternal Substance Abuse:  No Significant Maternal Medications:  None Significant Maternal Lab Results:  Lab values include: Group B Strep negative Other Comments:  None  Review of Systems  Constitutional: Negative.  Negative for fever and chills.  HENT: Negative for hearing loss.   Eyes: Negative.  Negative for blurred vision and double vision.  Respiratory: Negative for cough and hemoptysis.   Cardiovascular: Negative.  Negative for chest pain and palpitations.  Gastrointestinal: Negative.  Negative for heartburn and  nausea.  Genitourinary: Negative.  Negative for dysuria and urgency.  Musculoskeletal: Negative for myalgias and neck pain.  Skin: Negative for itching and rash.  Neurological: Negative for dizziness, tingling and headaches.  Endo/Heme/Allergies: Negative.  Negative for environmental allergies. Does not bruise/bleed easily.  Psychiatric/Behavioral: Negative for depression.  All other systems reviewed and are negative.   Dilation: 1 Effacement (%): Thick Station: -2 Exam by:: Foley,rn Blood pressure 131/81, pulse 81, temperature 98.5 F (36.9 C), temperature source Oral, resp. rate 20, height 5\' 5"  (1.651 m), weight 86.183 kg (190 lb), last menstrual period 06/07/2014. Maternal Exam:  Uterine Assessment: Contraction strength is mild.  Contraction duration is 30 seconds. Contraction frequency is rare.   Abdomen: Patient reports no abdominal tenderness. Fundal height is 41 cm.   Estimated fetal weight is 3000 grams.   Fetal presentation: vertex  Introitus: Normal vulva. Normal vagina.  Ferning test: not done.  Nitrazine test: not done. Amniotic fluid character: not assessed.  Pelvis: adequate for delivery.   Cervix: Cervix evaluated by digital exam.   1/ long / posterior  Fetal Exam Fetal Monitor Review: Baseline rate: 145.  Variability: moderate (6-25 bpm).   Pattern: no decelerations and no accelerations.    Fetal State Assessment: Category I - tracings are normal.     Physical Exam  Vitals reviewed. Constitutional: She is oriented to person, place, and time. She appears well-developed and well-nourished.  HENT:  Head: Normocephalic.  Eyes: Pupils are equal, round, and reactive to light.  Neck:  Normal range of motion.  Cardiovascular: Normal rate and regular rhythm.   Respiratory: Effort normal.  GI: Soft.  Genitourinary: Vagina normal.  Neurological: She is alert and oriented to person, place, and time.  Skin: Skin is warm.  Psychiatric: She has a normal mood and  affect.    Prenatal labs: ABO, Rh: --/--/O POS (09/06 1750) Antibody: NEG (09/06 1750) Rubella: Immune (04/04 0000) RPR: Nonreactive (04/05 0000)  HBsAg: Negative (04/05 0000)  HIV: Non-reactive (04/05 0000)  GBS: Negative (04/05 0000)   Assessment/Plan: 24 yo G1P0 SIUP at 41 weeks 1 day with oligohydramnios Admit to Labor & Delivery for Induction of labor Continuous monitoring Cervical ripening with misoprostol Epidural on demand   Anelia Carriveau STACIA 02/20/2015, 9:18 PM

## 2015-02-21 ENCOUNTER — Inpatient Hospital Stay (HOSPITAL_COMMUNITY): Payer: No Typology Code available for payment source | Admitting: Anesthesiology

## 2015-02-21 LAB — RPR: RPR Ser Ql: NONREACTIVE

## 2015-02-21 LAB — HIV ANTIBODY (ROUTINE TESTING W REFLEX): HIV Screen 4th Generation wRfx: NONREACTIVE

## 2015-02-21 MED ORDER — EPHEDRINE 5 MG/ML INJ
10.0000 mg | INTRAVENOUS | Status: DC | PRN
  Filled 2015-02-21: qty 2

## 2015-02-21 MED ORDER — PHENYLEPHRINE 40 MCG/ML (10ML) SYRINGE FOR IV PUSH (FOR BLOOD PRESSURE SUPPORT)
80.0000 ug | PREFILLED_SYRINGE | INTRAVENOUS | Status: DC | PRN
  Filled 2015-02-21: qty 20
  Filled 2015-02-21: qty 2

## 2015-02-21 MED ORDER — OXYTOCIN 40 UNITS IN LACTATED RINGERS INFUSION - SIMPLE MED
1.0000 m[IU]/min | INTRAVENOUS | Status: DC
  Administered 2015-02-21 – 2015-02-22 (×2): 2 m[IU]/min via INTRAVENOUS
  Filled 2015-02-21: qty 1000

## 2015-02-21 MED ORDER — LIDOCAINE HCL (PF) 1 % IJ SOLN
INTRAMUSCULAR | Status: DC | PRN
  Administered 2015-02-21 (×2): 4 mL

## 2015-02-21 MED ORDER — TERBUTALINE SULFATE 1 MG/ML IJ SOLN
0.2500 mg | Freq: Once | INTRAMUSCULAR | Status: DC | PRN
  Filled 2015-02-21: qty 1

## 2015-02-21 MED ORDER — FENTANYL 2.5 MCG/ML BUPIVACAINE 1/10 % EPIDURAL INFUSION (WH - ANES)
14.0000 mL/h | INTRAMUSCULAR | Status: DC | PRN
  Administered 2015-02-21 – 2015-02-22 (×4): 14 mL/h via EPIDURAL
  Filled 2015-02-21 (×3): qty 125

## 2015-02-21 MED ORDER — DIPHENHYDRAMINE HCL 50 MG/ML IJ SOLN: 12.5000 mg | INTRAMUSCULAR | Status: DC | PRN

## 2015-02-21 NOTE — Anesthesia Procedure Notes (Signed)
Epidural Patient location during procedure: OB  Staffing Anesthesiologist: Blythe Hartshorn Performed by: anesthesiologist   Preanesthetic Checklist Completed: patient identified, site marked, surgical consent, pre-op evaluation, timeout performed, IV checked, risks and benefits discussed and monitors and equipment checked  Epidural Patient position: sitting Prep: site prepped and draped and DuraPrep Patient monitoring: continuous pulse ox and blood pressure Approach: midline Location: L3-L4 Injection technique: LOR saline  Needle:  Needle type: Tuohy  Needle gauge: 17 G Needle length: 9 cm and 9 Needle insertion depth: 6.5 cm Catheter type: closed end flexible Catheter size: 19 Gauge Catheter at skin depth: 11 cm Test dose: negative  Assessment Events: blood not aspirated, injection not painful, no injection resistance, negative IV test and no paresthesia  Additional Notes Patient identified. Risks/Benefits/Options discussed with patient including but not limited to bleeding, infection, nerve damage, paralysis, failed block, incomplete pain control, headache, blood pressure changes, nausea, vomiting, reactions to medication both or allergic, itching and postpartum back pain. Confirmed with bedside nurse the patient's most recent platelet count. Confirmed with patient that they are not currently taking any anticoagulation, have any bleeding history or any family history of bleeding disorders. Patient expressed understanding and wished to proceed. All questions were answered. Sterile technique was used throughout the entire procedure. Please see nursing notes for vital signs. Test dose was given through epidural catheter and negative prior to continuing to dose epidural or start infusion. Warning signs of high block given to the patient including shortness of breath, tingling/numbness in hands, complete motor block, or any concerning symptoms with instructions to call for help. Patient was  given instructions on fall risk and not to get out of bed. All questions and concerns addressed with instructions to call with any issues or inadequate analgesia.      

## 2015-02-21 NOTE — Anesthesia Preprocedure Evaluation (Signed)
Anesthesia Evaluation  Patient identified by MRN, date of birth, ID band Patient awake    Reviewed: Allergy & Precautions, NPO status , Patient's Chart, lab work & pertinent test results  History of Anesthesia Complications Negative for: history of anesthetic complications  Airway Mallampati: II  TM Distance: >3 FB Neck ROM: Full    Dental no notable dental hx. (+) Dental Advisory Given   Pulmonary neg pulmonary ROS,    Pulmonary exam normal breath sounds clear to auscultation       Cardiovascular negative cardio ROS Normal cardiovascular exam Rhythm:Regular Rate:Normal     Neuro/Psych PSYCHIATRIC DISORDERS negative neurological ROS  negative psych ROS   GI/Hepatic negative GI ROS, Neg liver ROS,   Endo/Other  obesity  Renal/GU negative Renal ROS  negative genitourinary   Musculoskeletal negative musculoskeletal ROS (+)   Abdominal   Peds negative pediatric ROS (+)  Hematology negative hematology ROS (+)   Anesthesia Other Findings   Reproductive/Obstetrics (+) Pregnancy                             Anesthesia Physical Anesthesia Plan  ASA: II  Anesthesia Plan: Epidural   Post-op Pain Management:    Induction:   Airway Management Planned:   Additional Equipment:   Intra-op Plan:   Post-operative Plan:   Informed Consent: I have reviewed the patients History and Physical, chart, labs and discussed the procedure including the risks, benefits and alternatives for the proposed anesthesia with the patient or authorized representative who has indicated his/her understanding and acceptance.     Plan Discussed with:   Anesthesia Plan Comments:         Anesthesia Quick Evaluation

## 2015-02-21 NOTE — Progress Notes (Signed)
Pt may get up to walk on unit with mother.  Updated Dr Claiborne Billings on [facility] and uterine activity.

## 2015-02-21 NOTE — Progress Notes (Signed)
Pt without complaints. Ctx too frequently for add'l cytotec On exam 2-3cm/70/-2 Start pitocin Pt allowed to walk At 2:30pm pitocin had not been started Requested RN check pt and start pitocin Epidural upon request.

## 2015-02-21 NOTE — Progress Notes (Signed)
Pt feeling contractions, has received one dose of IV pain medication. FHT: 130 reactive TOCO q2-3 SVE: 3/70/-1 Head not well applied, will recheck and consider AROM when possible. Continue pitocin augmentation and other current mgmt

## 2015-02-22 ENCOUNTER — Encounter (HOSPITAL_COMMUNITY): Payer: Self-pay | Admitting: *Deleted

## 2015-02-22 DIAGNOSIS — O4100X0 Oligohydramnios, unspecified trimester, not applicable or unspecified: Secondary | ICD-10-CM | POA: Diagnosis present

## 2015-02-22 MED ORDER — SIMETHICONE 80 MG PO CHEW: 80.0000 mg | CHEWABLE_TABLET | ORAL | Status: DC | PRN

## 2015-02-22 MED ORDER — SENNOSIDES-DOCUSATE SODIUM 8.6-50 MG PO TABS
2.0000 | ORAL_TABLET | ORAL | Status: DC
  Administered 2015-02-23 (×2): 2 via ORAL
  Filled 2015-02-22 (×2): qty 2

## 2015-02-22 MED ORDER — ONDANSETRON HCL 4 MG/2ML IJ SOLN: 4.0000 mg | INTRAMUSCULAR | Status: DC | PRN

## 2015-02-22 MED ORDER — BENZOCAINE-MENTHOL 20-0.5 % EX AERO
1.0000 "application " | INHALATION_SPRAY | CUTANEOUS | Status: DC | PRN
  Administered 2015-02-22: 1 via TOPICAL
  Filled 2015-02-22: qty 56

## 2015-02-22 MED ORDER — WITCH HAZEL-GLYCERIN EX PADS: 1.0000 "application " | MEDICATED_PAD | CUTANEOUS | Status: DC | PRN

## 2015-02-22 MED ORDER — ACETAMINOPHEN 325 MG PO TABS: 650.0000 mg | ORAL_TABLET | ORAL | Status: DC | PRN

## 2015-02-22 MED ORDER — IBUPROFEN 600 MG PO TABS
600.0000 mg | ORAL_TABLET | Freq: Four times a day (QID) | ORAL | Status: DC
  Administered 2015-02-23 (×5): 600 mg via ORAL
  Filled 2015-02-22 (×6): qty 1

## 2015-02-22 MED ORDER — DIPHENHYDRAMINE HCL 25 MG PO CAPS: 25.0000 mg | ORAL_CAPSULE | Freq: Four times a day (QID) | ORAL | Status: DC | PRN

## 2015-02-22 MED ORDER — OXYCODONE-ACETAMINOPHEN 5-325 MG PO TABS: 2.0000 | ORAL_TABLET | ORAL | Status: DC | PRN

## 2015-02-22 MED ORDER — OXYCODONE-ACETAMINOPHEN 5-325 MG PO TABS: 1.0000 | ORAL_TABLET | ORAL | Status: DC | PRN

## 2015-02-22 MED ORDER — LANOLIN HYDROUS EX OINT: TOPICAL_OINTMENT | CUTANEOUS | Status: DC | PRN

## 2015-02-22 MED ORDER — PRENATAL MULTIVITAMIN CH
1.0000 | ORAL_TABLET | Freq: Every day | ORAL | Status: DC
  Administered 2015-02-23: 1 via ORAL
  Filled 2015-02-22: qty 1

## 2015-02-22 MED ORDER — ONDANSETRON HCL 4 MG PO TABS: 4.0000 mg | ORAL_TABLET | ORAL | Status: DC | PRN

## 2015-02-22 MED ORDER — IBUPROFEN 600 MG PO TABS
600.0000 mg | ORAL_TABLET | Freq: Four times a day (QID) | ORAL | Status: DC | PRN
  Administered 2015-02-22: 600 mg via ORAL
  Filled 2015-02-22: qty 1

## 2015-02-22 MED ORDER — DIBUCAINE 1 % RE OINT: 1.0000 "application " | TOPICAL_OINTMENT | RECTAL | Status: DC | PRN

## 2015-02-22 MED ORDER — TETANUS-DIPHTH-ACELL PERTUSSIS 5-2.5-18.5 LF-MCG/0.5 IM SUSP: 0.5000 mL | Freq: Once | INTRAMUSCULAR | Status: DC

## 2015-02-22 NOTE — Progress Notes (Signed)
MOB was referred for history of depression/anxiety.  Referral is screened out by Clinical Social Worker because none of the following criteria appear to apply: -History of anxiety/depression during this pregnancy, or of post-partum depression. - Diagnosis of anxiety and/or depression within last 3 years or -MOB's symptoms are currently being treated with medication and/or therapy.  Per chart review, MOB experienced situational anxiety in 2009.  Depression/anxiety not listed as a current problem in her prenatal record.   Please contact the Clinical Social Worker if needs arise or upon MOB request.  

## 2015-02-22 NOTE — Progress Notes (Signed)
Prolonged deceleration in FHR began 0452, baseline returning to normal at 0458.  Patient's position was changed to right lateral then left lateral.  Pitocin was discontinued, fluid bolus initiated, and O2 administered to patient.  Dr. Claiborne Billings was attempted called, awaiting answer or return call.  Wolfgang Phoenix RN [phone]

## 2015-02-23 ENCOUNTER — Inpatient Hospital Stay (HOSPITAL_COMMUNITY): Admission: RE | Admit: 2015-02-23 | Payer: No Typology Code available for payment source | Source: Ambulatory Visit

## 2015-02-23 LAB — CBC
HEMATOCRIT: 27.6 % — AB (ref 36.0–46.0)
HEMOGLOBIN: 9.2 g/dL — AB (ref 12.0–15.0)
MCH: 27.2 pg (ref 26.0–34.0)
MCHC: 33.3 g/dL (ref 30.0–36.0)
MCV: 81.7 fL (ref 78.0–100.0)
Platelets: 170 10*3/uL (ref 150–400)
RBC: 3.38 MIL/uL — AB (ref 3.87–5.11)
RDW: 16.3 % — ABNORMAL HIGH (ref 11.5–15.5)
WBC: 13.7 10*3/uL — ABNORMAL HIGH (ref 4.0–10.5)

## 2015-02-23 NOTE — Progress Notes (Signed)
Post Partum Day 1 Subjective: no complaints, up ad lib, voiding, tolerating PO, + flatus and breast feeding  Objective: Blood pressure 112/66, pulse 70, temperature 97.5 F (36.4 C), temperature source Oral, resp. rate 18, height  (1.651 m), weight 86.183 kg (190 lb), last menstrual period 06/07/2014, SpO2 99 %, unknown if currently breastfeeding.  Physical Exam:  General: alert, cooperative and no distress Lochia: appropriate Uterine Fundus: firm perineum: healing well, no significant drainage, no dehiscence DVT Evaluation: No evidence of DVT seen on physical exam. Negative Homan's sign. No cords or calf tenderness.   Recent Labs  02/20/15 1750 02/23/15 0720  HGB 11.8* 9.2*  HCT 34.9* 27.6*    Assessment/Plan: Plan for discharge tomorrow and Breastfeeding   LOS: 3 days   Angelica Hester STACIA 02/23/2015, 9:24 AM

## 2015-02-24 MED ORDER — IBUPROFEN 600 MG PO TABS: 600.0000 mg | ORAL_TABLET | Freq: Four times a day (QID) | ORAL | Status: AC | PRN

## 2015-02-24 NOTE — Lactation Note (Signed)
This note was copied from the chart of Angelica Azari Janssens. Lactation Consultation Note  Patient Name: Angelica Hester [id number] Date: 02/24/2015 Reason for consult: Initial assessment;Difficult latch Mom had baby latched to left breast when LC arrived using #24 nipple shield. When Mom switched baby to other breast, LC changed nipple shield to size 20 for better fit. Mom reports increase comfort. Colostrum visible in the nipple shield with the feeding. Basic teaching reviewed, cluster feeding discussed. Encouraged to post pump using hand pump after some feedings.  Lactation brochure left for review, advised of OP services and support group. Encouraged to make OP f/u with LC for next week since using nipple shield to latch. Call for questions/concerns.   Maternal Data    Feeding Feeding Type: Breast Fed Length of feed: 10 min  LATCH Score/Interventions Latch: Grasps breast easily, tongue down, lips flanged, rhythmical sucking. (using #20 nipple shield)  Audible Swallowing: Spontaneous and intermittent  Type of Nipple: Everted at rest and after stimulation (short shaft bilateral) Intervention(s): Hand pump  Comfort (Breast/Nipple): Filling, red/small blisters or bruises, mild/mod discomfort  Problem noted: Mild/Moderate discomfort Interventions (Mild/moderate discomfort): Hand massage;Hand expression;Comfort gels  Hold (Positioning): Assistance needed to correctly position infant at breast and maintain latch.  LATCH Score: 8  Lactation Tools Discussed/Used Tools: Nipple Shields;Pump;Comfort gels Nipple shield size: 24;20 Breast pump type: Manual   Consult Status Consult Status: Follow-up Date: 02/24/15 Follow-up type: In-patient    Angelica Hester 02/24/2015, 1:08 AM

## 2015-02-24 NOTE — Discharge Summary (Signed)
Obstetric Discharge Summary Reason for Admission: induction of labor Prenatal Procedures: NST and ultrasound Intrapartum Procedures: spontaneous vaginal delivery Postpartum Procedures: none Complications-Operative and Postpartum: 2nd degree perineal laceration HEMOGLOBIN  Date Value Ref Range Status  02/23/2015 9.2* 12.0 - 15.0 g/dL Final   HCT  Date Value Ref Range Status  02/23/2015 27.6* 36.0 - 46.0 % Final    Physical Exam:  General: alert, cooperative and no distress Lochia: appropriate Uterine Fundus: firm perineum healing well, no significant drainage, no dehiscence, no significant erythema DVT Evaluation: No evidence of DVT seen on physical exam. Negative Homan's sign. Calf/Ankle edema is present.  Discharge Diagnoses: Term Pregnancy-delivered  Discharge Information: Date: 02/24/2015 Activity: pelvic rest Diet: routine Medications: PNV and Ibuprofen Condition: stable Instructions: refer to practice specific booklet Discharge to: home   Newborn Data: Live born female  Birth Weight: 7 lb 5.5 oz (3330 g) APGAR: 8, 9  Home with mother.  Essie Hart STACIA 02/24/2015, 11:20 AM

## 2015-02-24 NOTE — Lactation Note (Signed)
This note was copied from the chart of Angelica Averie Hornbaker. Lactation Consultation Note  Mother stated baby wanting to breastfeed often from 9pm to 1 am and she was concerned so she requested formula. Educated mother about cluster feeding. Discussed engorgement care and provided mother w/ another #20NS. Suggest she call for assistance w/ next feeding.     Patient Name: Angelica Hester [id number] Date: 02/24/2015 Reason for consult: Follow-up assessment   Maternal Data    Feeding Feeding Type: Breast Fed  LATCH Score/Interventions                      Lactation Tools Discussed/Used     Consult Status Consult Status: PRN    Angelica Hester 02/24/2015, 9:30 AM

## 2015-02-24 NOTE — Progress Notes (Signed)
Post Partum Day 2 Subjective: no complaints, up ad lib, voiding, tolerating PO and + flatus  Objective: Blood pressure 115/77, pulse 66, temperature 98 F (36.7 C), temperature source Oral, resp. rate 18, height  (1.651 m), weight 86.183 kg (190 lb), last menstrual period 06/07/2014, SpO2 99 %, unknown if currently breastfeeding.  Physical Exam:  General: alert, cooperative and no distress Lochia: appropriate Uterine Fundus: firm perineum healing well, no significant drainage, no dehiscence, no significant erythema DVT Evaluation: No evidence of DVT seen on physical exam. Negative Homan's sign. Calf/Ankle edema is present.   Recent Labs  02/23/15 0720  HGB 9.2*  HCT 27.6*    Assessment/Plan: Discharge home, Circumcision in office and Contraception will discuss at post partum visit   LOS: 4 days   Elber Galyean STACIA 02/24/2015, 11:17 AM

## 2015-09-20 IMAGING — US US RENAL
1 series · 14 of 25 positions shown · non-contrast
Comparison: None.

CLINICAL DATA: Pyelonephritis, 31 weeks pregnant

EXAM:
RENAL / URINARY TRACT ULTRASOUND COMPLETE

[Series 1: us renal · 14 of 36 slices shown]
[im 1/36]
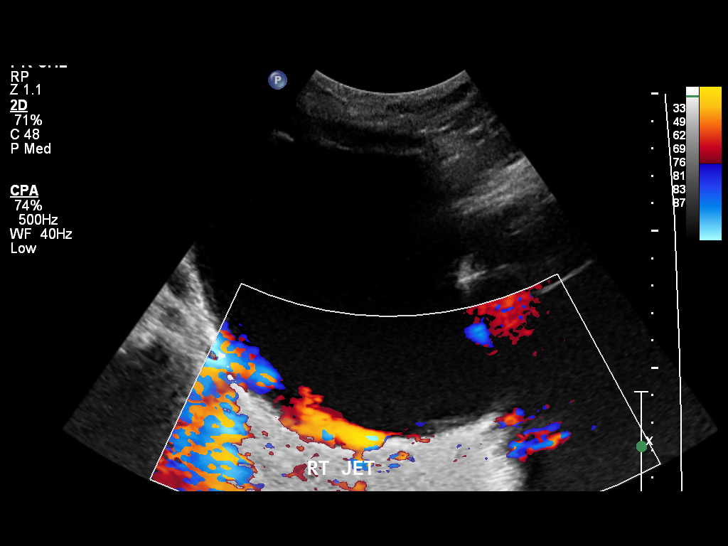
[im 3/36]
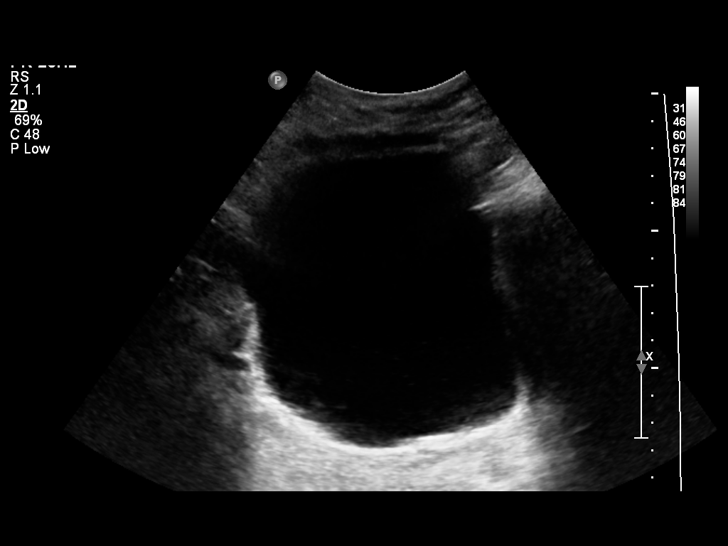
[im 6/36]
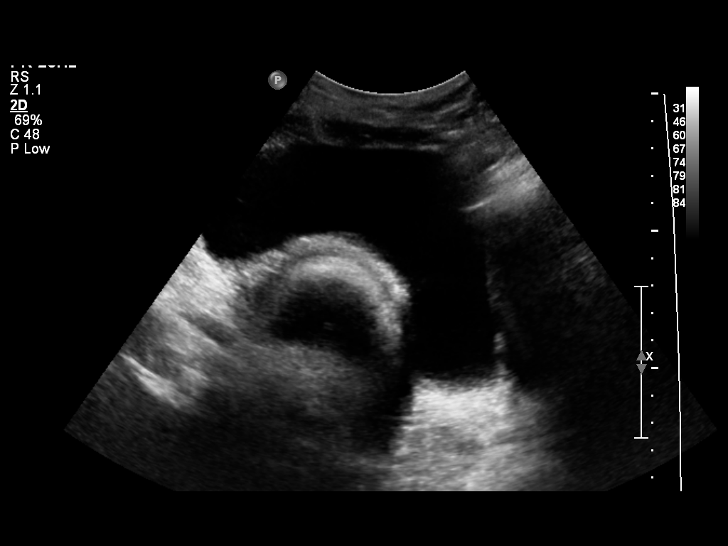
[im 9/36]
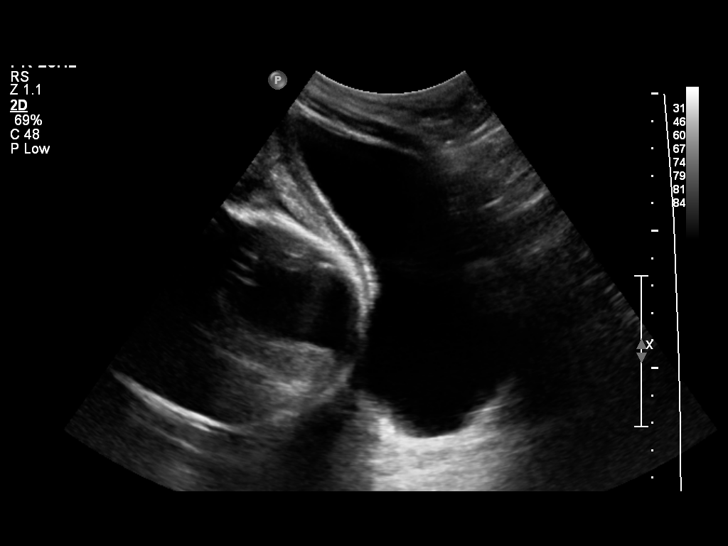
[im 12/36]
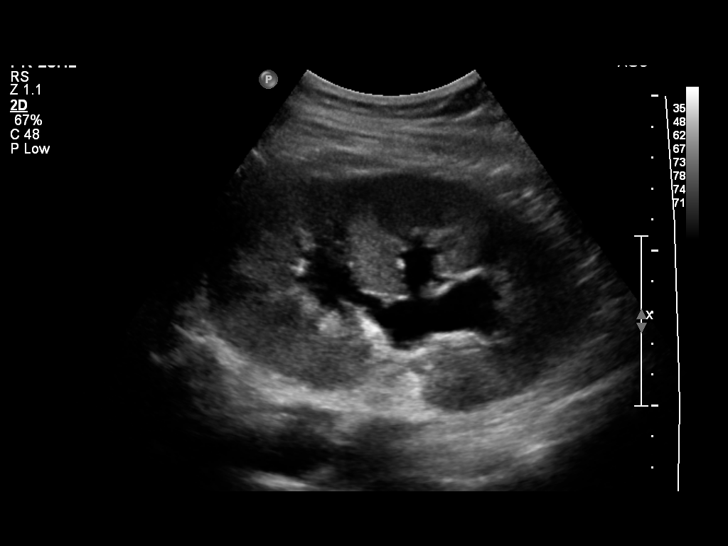
[im 14/36]
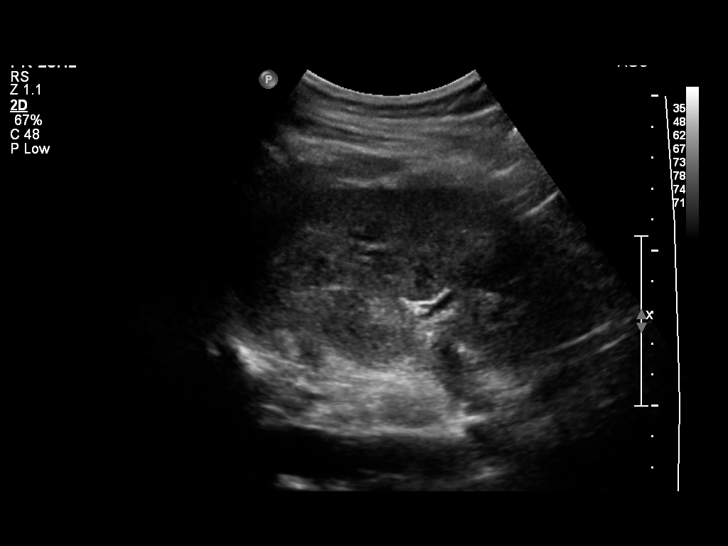
[im 17/36]
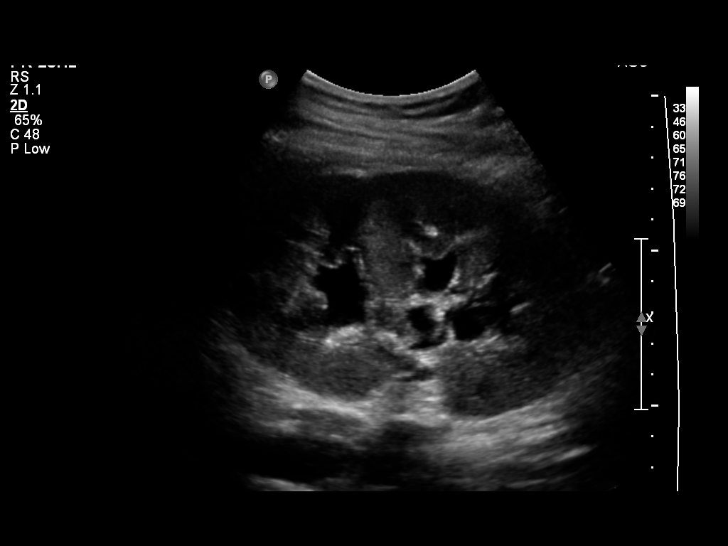
[im 19/36]
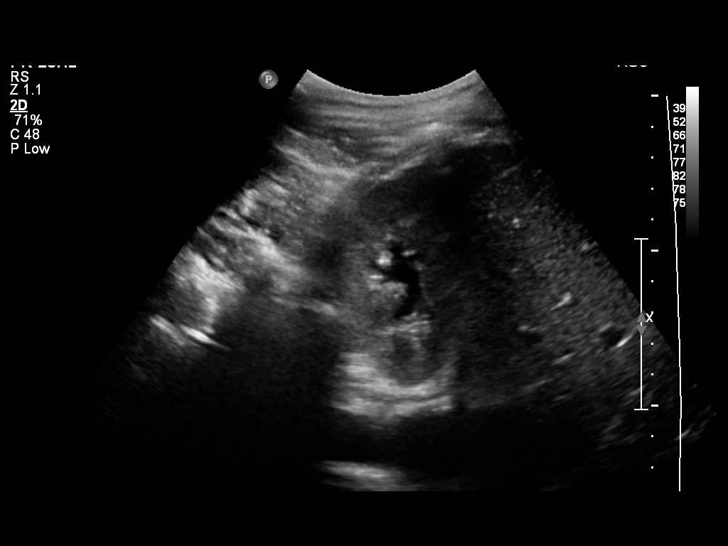
[im 22/36]
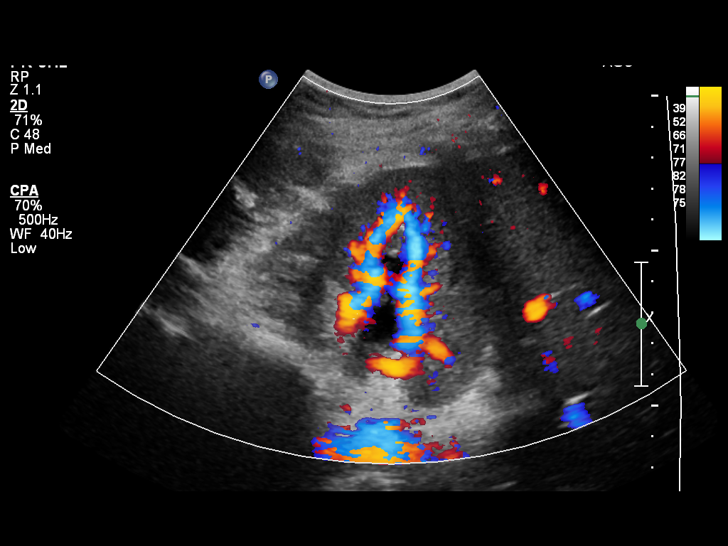
[im 24/36]
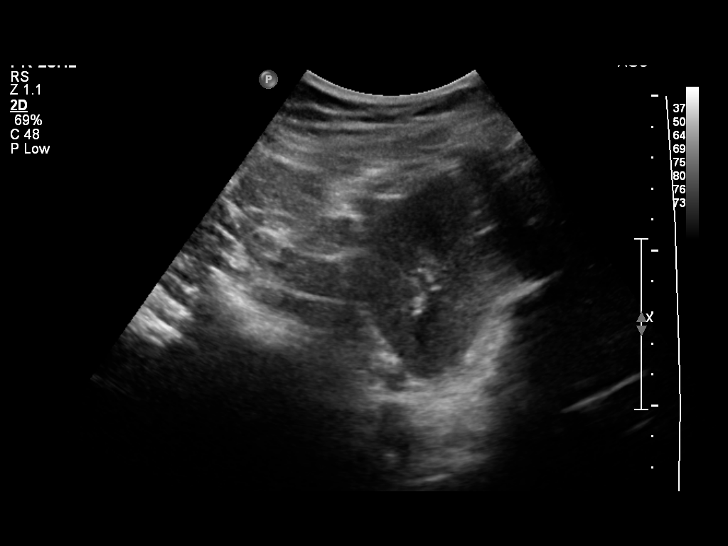
[im 27/36]
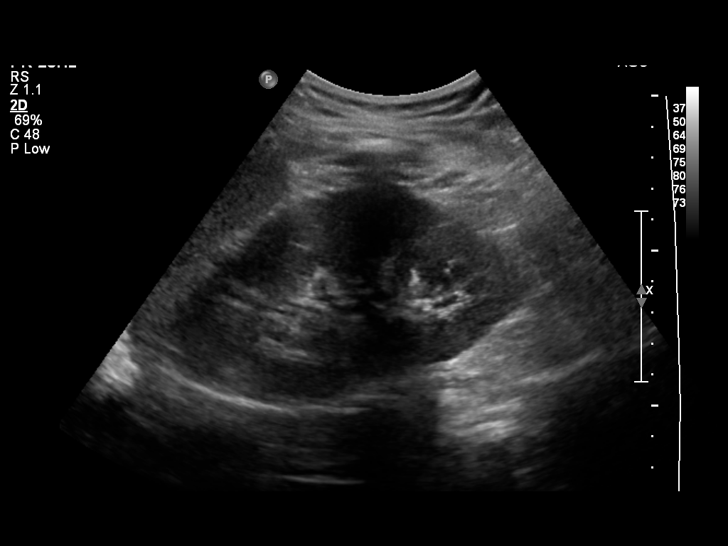
[im 30/36]
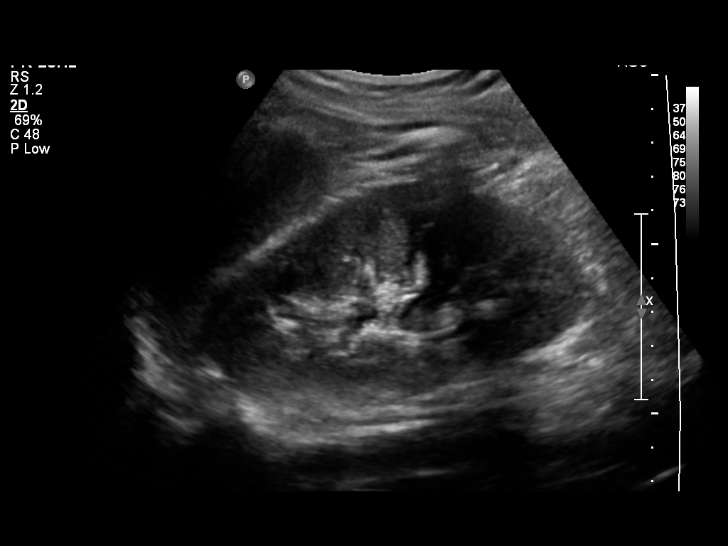
[im 33/36]
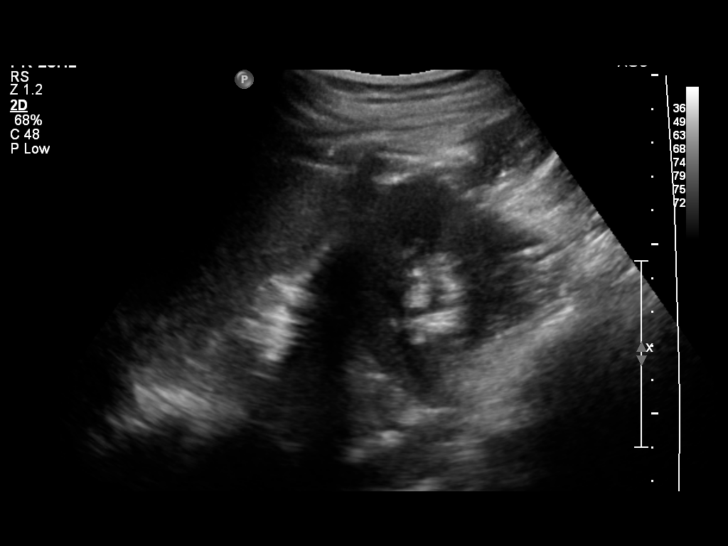
[im 36/36]
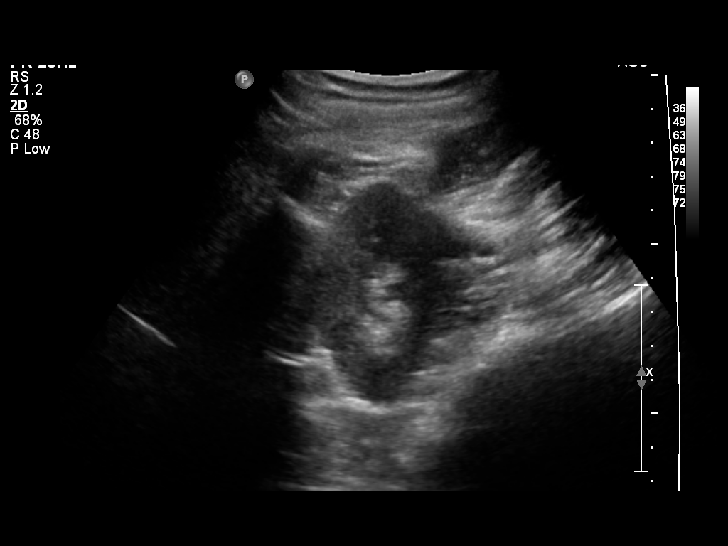

[14 of 25 positions shown; findings below may reference images not displayed]

FINDINGS: Right Kidney:

Length: 12.2 cm. Mild right hydro nephrosis. No cortical thinning or
focal mass. No stone identified. Maximal renal pelvis diameter
cm.

Left Kidney:

Length: 11.8 cm. Echogenicity within normal limits. No mass or
hydronephrosis visualized.

Bladder:

Appears normal for degree of bladder distention. Bilateral ureteral
jets visualized.
IMPRESSION: Mild right hydronephrosis.

## 2016-07-07 ENCOUNTER — Encounter (HOSPITAL_COMMUNITY): Payer: Self-pay | Admitting: *Deleted

## 2016-07-07 ENCOUNTER — Telehealth: Payer: Self-pay | Admitting: Advanced Practice Midwife

## 2016-07-07 ENCOUNTER — Inpatient Hospital Stay (HOSPITAL_COMMUNITY)
Admission: AD | Admit: 2016-07-07 | Discharge: 2016-07-07 | Discharge status: Against Medical Advice | Payer: Medicaid Other | Source: Ambulatory Visit | Attending: Family Medicine | Admitting: Family Medicine

## 2016-07-07 DIAGNOSIS — Z5321 Procedure and treatment not carried out due to patient leaving prior to being seen by health care provider: Secondary | ICD-10-CM | POA: Diagnosis present

## 2016-07-07 HISTORY — DX: Urinary tract infection, site not specified: N39.0

## 2016-07-07 LAB — URINALYSIS, ROUTINE W REFLEX MICROSCOPIC
BILIRUBIN URINE: NEGATIVE
GLUCOSE, UA: NEGATIVE mg/dL
KETONES UR: NEGATIVE mg/dL
LEUKOCYTES UA: NEGATIVE
Nitrite: NEGATIVE
PH: 6 (ref 5.0–8.0)
PROTEIN: NEGATIVE mg/dL
Specific Gravity, Urine: 1.013 (ref 1.005–1.030)

## 2016-07-07 LAB — POCT PREGNANCY, URINE: Preg Test, Ur: POSITIVE — AB

## 2016-07-07 NOTE — MAU Note (Signed)
PT  SAYS SHE HAS  BROWN  D/C  X2-3  WEEKS     SAYS URINE   HAS FOUL  ODOR   AND  CLOUDY  - HAS HX  OF  UTI'S.    NO BIRTH CONTROL.  LAST SEX-   DEC 2017.

## 2016-07-07 NOTE — MAU Note (Signed)
Pt states she does not want to wait any longer and will follow up with her regular doctor. Educated on risks of leaving and pt signed out AMA

## 2016-07-07 NOTE — Telephone Encounter (Signed)
Pt checked in to MAU tonight for abdominal pain and spotting and possible UTI.  Pregnancy test positive.  RN notified her of positive pregnancy test.  Pt left MAU AMA.  Attempted to call pt regarding positive pregnancy test and recommendation that she return and complete evaluation to rule out ectopic pregnancy.  Pt phone number not in service.
